# Patient Record
Sex: Female | Born: 1965
Health system: Southern US, Community
[De-identification: ages and names within clinical notes are randomized; demographics above are authoritative.]

## PROBLEM LIST (undated history)

## (undated) DIAGNOSIS — C4492 Squamous cell carcinoma of skin, unspecified: Secondary | ICD-10-CM

## (undated) DIAGNOSIS — E119 Type 2 diabetes mellitus without complications: Secondary | ICD-10-CM

## (undated) DIAGNOSIS — M1990 Unspecified osteoarthritis, unspecified site: Secondary | ICD-10-CM

## (undated) DIAGNOSIS — D649 Anemia, unspecified: Secondary | ICD-10-CM

## (undated) DIAGNOSIS — K5090 Crohn's disease, unspecified, without complications: Secondary | ICD-10-CM

## (undated) DIAGNOSIS — K5190 Ulcerative colitis, unspecified, without complications: Secondary | ICD-10-CM

## (undated) DIAGNOSIS — A64 Unspecified sexually transmitted disease: Secondary | ICD-10-CM

## (undated) HISTORY — DX: Squamous cell carcinoma of skin, unspecified: C44.92

## (undated) HISTORY — DX: Ulcerative colitis, unspecified, without complications: K51.90

## (undated) HISTORY — DX: Type 2 diabetes mellitus without complications: E11.9

## (undated) HISTORY — DX: Anemia, unspecified: D64.9

## (undated) HISTORY — PX: TONSILLECTOMY: SUR1361

## (undated) HISTORY — PX: REDUCTION MAMMAPLASTY: SUR839

## (undated) HISTORY — PX: COLPOSCOPY: SHX161

## (undated) HISTORY — DX: Unspecified sexually transmitted disease: A64

## (undated) HISTORY — PX: OTHER SURGICAL HISTORY: SHX169

## (undated) HISTORY — DX: Unspecified osteoarthritis, unspecified site: M19.90

---

## 1989-08-12 HISTORY — PX: BREAST REDUCTION SURGERY: SHX8

## 1989-11-10 DIAGNOSIS — K5090 Crohn's disease, unspecified, without complications: Secondary | ICD-10-CM | POA: Insufficient documentation

## 1998-05-15 ENCOUNTER — Ambulatory Visit (HOSPITAL_COMMUNITY): Admission: RE | Admit: 1998-05-15 | Discharge: 1998-05-15 | Payer: Self-pay | Admitting: Gastroenterology

## 1998-07-19 ENCOUNTER — Ambulatory Visit (HOSPITAL_COMMUNITY): Admission: RE | Admit: 1998-07-19 | Discharge: 1998-07-19 | Payer: Self-pay | Admitting: Gastroenterology

## 1998-10-11 HISTORY — PX: COLON RESECTION: SHX5231

## 1999-03-12 ENCOUNTER — Other Ambulatory Visit: Admission: RE | Admit: 1999-03-12 | Discharge: 1999-03-12 | Payer: Self-pay | Admitting: Obstetrics and Gynecology

## 2000-03-11 ENCOUNTER — Other Ambulatory Visit: Admission: RE | Admit: 2000-03-11 | Discharge: 2000-03-11 | Payer: Self-pay | Admitting: Obstetrics and Gynecology

## 2001-03-16 ENCOUNTER — Other Ambulatory Visit: Admission: RE | Admit: 2001-03-16 | Discharge: 2001-03-16 | Payer: Self-pay | Admitting: Obstetrics and Gynecology

## 2002-03-23 ENCOUNTER — Other Ambulatory Visit: Admission: RE | Admit: 2002-03-23 | Discharge: 2002-03-23 | Payer: Self-pay | Admitting: Obstetrics and Gynecology

## 2002-11-01 ENCOUNTER — Other Ambulatory Visit: Admission: RE | Admit: 2002-11-01 | Discharge: 2002-11-01 | Payer: Self-pay | Admitting: Obstetrics and Gynecology

## 2002-11-11 HISTORY — PX: APPENDECTOMY: SHX54

## 2002-11-23 ENCOUNTER — Inpatient Hospital Stay (HOSPITAL_COMMUNITY): Admission: EM | Admit: 2002-11-23 | Discharge: 2002-11-24 | Payer: Self-pay | Admitting: Emergency Medicine

## 2002-11-23 ENCOUNTER — Encounter (INDEPENDENT_AMBULATORY_CARE_PROVIDER_SITE_OTHER): Payer: Self-pay | Admitting: Specialist

## 2002-11-23 ENCOUNTER — Encounter: Payer: Self-pay | Admitting: Emergency Medicine

## 2003-03-13 DIAGNOSIS — A64 Unspecified sexually transmitted disease: Secondary | ICD-10-CM

## 2003-03-13 HISTORY — DX: Unspecified sexually transmitted disease: A64

## 2003-04-06 ENCOUNTER — Other Ambulatory Visit: Admission: RE | Admit: 2003-04-06 | Discharge: 2003-04-06 | Payer: Self-pay | Admitting: Obstetrics and Gynecology

## 2004-05-09 ENCOUNTER — Other Ambulatory Visit: Admission: RE | Admit: 2004-05-09 | Discharge: 2004-05-09 | Payer: Self-pay | Admitting: Obstetrics and Gynecology

## 2005-05-20 ENCOUNTER — Other Ambulatory Visit: Admission: RE | Admit: 2005-05-20 | Discharge: 2005-05-20 | Payer: Self-pay | Admitting: Obstetrics and Gynecology

## 2006-05-26 ENCOUNTER — Other Ambulatory Visit: Admission: RE | Admit: 2006-05-26 | Discharge: 2006-05-26 | Payer: Self-pay | Admitting: Obstetrics & Gynecology

## 2007-06-08 ENCOUNTER — Other Ambulatory Visit: Admission: RE | Admit: 2007-06-08 | Discharge: 2007-06-08 | Payer: Self-pay | Admitting: Obstetrics and Gynecology

## 2007-06-13 HISTORY — PX: PARTIAL COLECTOMY: SHX5273

## 2008-06-12 DIAGNOSIS — E119 Type 2 diabetes mellitus without complications: Secondary | ICD-10-CM

## 2008-06-12 HISTORY — DX: Type 2 diabetes mellitus without complications: E11.9

## 2008-06-13 ENCOUNTER — Other Ambulatory Visit: Admission: RE | Admit: 2008-06-13 | Discharge: 2008-06-13 | Payer: Self-pay | Admitting: Obstetrics and Gynecology

## 2008-07-18 ENCOUNTER — Encounter: Admission: RE | Admit: 2008-07-18 | Discharge: 2008-08-16 | Payer: Self-pay | Admitting: Endocrinology

## 2009-06-12 DIAGNOSIS — M1990 Unspecified osteoarthritis, unspecified site: Secondary | ICD-10-CM

## 2009-06-12 HISTORY — DX: Unspecified osteoarthritis, unspecified site: M19.90

## 2009-06-21 ENCOUNTER — Encounter: Admission: RE | Admit: 2009-06-21 | Discharge: 2009-06-21 | Payer: Self-pay | Admitting: Obstetrics and Gynecology

## 2009-10-10 HISTORY — PX: ESSURE TUBAL LIGATION: SUR464

## 2009-12-29 ENCOUNTER — Ambulatory Visit (HOSPITAL_COMMUNITY): Admission: RE | Admit: 2009-12-29 | Discharge: 2009-12-29 | Payer: Self-pay | Admitting: Obstetrics and Gynecology

## 2010-09-01 ENCOUNTER — Encounter: Payer: Self-pay | Admitting: Obstetrics and Gynecology

## 2010-09-02 ENCOUNTER — Encounter: Payer: Self-pay | Admitting: Obstetrics and Gynecology

## 2010-12-28 NOTE — Op Note (Signed)
NAMEARLINA, Chelsey James NO.:  [id number]   MEDICAL RECORD NO.:  [id number]                   PATIENT TYPE:  INP   LOCATION:  1823                                 FACILITY:  [facility]   PHYSICIAN:  Abigail Miyamoto, M.D.              DATE OF BIRTH:  1966-03-27   DATE OF PROCEDURE:  11/23/2002  DATE OF DISCHARGE:                                 OPERATIVE REPORT   PREOPERATIVE DIAGNOSIS:  Acute appendicitis.   POSTOPERATIVE DIAGNOSIS:  Acute appendicitis.   OPERATION:  Laparoscopic appendectomy.   SURGEON:  Abigail Miyamoto, M.D.   ANESTHESIA:  General endotracheal anesthesia, 0.25% Marcaine.   ESTIMATED BLOOD LOSS:  Minimal   INDICATIONS FOR PROCEDURE:  The patient is a 45 year old female who has had  Crohn's disease in the past, who presented with right lower quadrant  abdominal pain.  She was also found to have a fever and an elevated white  blood count.  CAT scan of the abdomen was performed which showed findings  consistent with acute appendicitis.   FINDINGS:  The patient was indeed found to have acute appendicitis without  evidence of rupture.  No evidence of Crohn's disease was identified.   DESCRIPTION OF PROCEDURE:  The patient was brought to the operating room and  identified.  She was placed supine on the operating room table and general  anesthesia was induced.  Her abdomen was then prepped and draped in the  usual sterile fashion.  Using a #16 blade a small transverse incision was  made below the umbilicus.  Incision was carried down to the fascia which was  made with a scalpel.  Hemostat was used to incise into the peritoneal  cavity.  Next a 0 Vicryl pursestring suture was placed around the fascial  opening.  The Hasson port was placed through the opening and insufflation of  the abdomen was begun.  Visualization of the cecum revealed some turbid  fluid around the cecum.  Next a 5 mm port was placed in the patient's right  upper  quadrant and a 10 mm port was placed in the patient's lower midline  under direct vision.  Dissection was then carried out around the appendix.  The appendix was identified and found to be acutely inflamed and enlarged.  The rest of the cecum and terminal ileum appeared normal.  Several adhesions  were then taken down with the harmonic scalpel.  The mesoappendix was then  identified and controlled blood clots with the harmonic scalpel.  Further  dissection revealed the base of the appendix.  The base of the appendix was  then transected with two separate firings of the EndoGIA stapler.  Once the  appendix was removed, the sacral arm was examined and found to be intact.  Hemostasis likewise appeared to be achieved.  At this point, the appendix  was placed in an endosac.  The abdominal was then copiously irrigated with  normal saline.  Again hemostasis appeared to be achieved.  Further  visualization of the abdomen failed to reveal any active Crohn's disease.  The patient did have some adhesions up to the midline that were left in  place.  They were just below the incision of the umbilicus.  The appendix  was then removed with the bag through the midline through the umbilical port  and a 0 Vicryl was tied in place closing the fascial defect.  Both the other  ports were likewise removed.  A Veress needle was placed through the right  upper quadrant incision in order to help remove the rest of the air out of  the abdominal cavity.  All port sites were then infiltrated with 0.25%  Marcaine and then closed with 4-  0 Monocryl subcuticular sutures.  Steri-Strips, gauze and tape were then  applied.  The patient tolerated the procedure well.  All sponge, needle and  instrument counts were correct at the end of the procedure.  The patient was  then extubated in the operating room and taken in stable condition to the  recovery room.                                               Abigail Miyamoto,  M.D.    DB/MEDQ  D:  11/23/2002  T:  11/23/2002  Job:  [id number]   cc:   Bernette Redbird, M.D.  [address]  [place], [place] [address]  Fax: [phone]

## 2010-12-28 NOTE — Consult Note (Signed)
Chelsey James, ABEE NO.:  [id number]   MEDICAL RECORD NO.:  [id number]                   PATIENT TYPE:  INP   LOCATION:  1823                                 FACILITY:  [facility]   PHYSICIAN:  Abigail Miyamoto, M.D.              DATE OF BIRTH:  [birth year]   DATE OF CONSULTATION:  11/23/2002  DATE OF DISCHARGE:                                   CONSULTATION   CHIEF COMPLAINT:  Right lower quadrant abdominal pain.   HISTORY OF PRESENT ILLNESS:  This is a 45 year old female with a history of  Crohn's disease in the past who developed sudden right lower quadrant  abdominal pain last evening.  It has progressed and become continually  worse.  She has had no nausea with this and has had some diarrhea stools but  no blood.  She says the pain is not similar to any kind she has had in the  past.  She has not had a flare up of Crohn's for over four years.  She has  had no chest pain or shortness of breath.  She has had fever and chills.  She has had no dysuria.   PAST MEDICAL HISTORY:  Crohn's disease for which she has had a left partial  colectomy and closure of a colovesical fistula.  This was done in [place], [place], approximately four years ago and again she has had no  problems since then.  The rest of her medical history is otherwise  unremarkable.   MEDICATIONS:  _______, Clarinex, Nasonex, Maxair.   ALLERGIES:  CIPRO, KEFLEX, and VALIUM.   HABITS:  She does smoke and drink alcohol occasionally.   REVIEW OF SYSTEMS:  This is otherwise unremarkable.   PHYSICAL EXAMINATION:  GENERAL APPEARANCE:  A well-developed, well-nourished  female in no acute distress.  VITAL SIGNS:  Temperature 101.4, heart rate 85, blood pressure 109/66,  respiratory rate 16.  HEENT:  Eyes:  She is anicteric.  Ears, nose mouth and throat:  Oropharynx  is clear.  NECK:  Supple.  LUNGS:  Clear to auscultation bilaterally.  CARDIOVASCULAR:  Regular rate and  rhythm.  ABDOMEN:  Soft.  There is a well-healed transverse incision.  There is  tenderness with guarding in the right lower quadrant.  There are no masses.  There is no organomegaly.  EXTREMITIES:  Warm and well perfused.  Gait is normal.   LABORATORY DATA:  The patient had an elevated white blood cell count of 14.8  with hemoglobin 12.2; neutrophils 80%.  Electrolytes normal.  Liver function  tests were normal.  Urinalysis was negative.   The patient had a CAT scan of the abdomen and pelvis which shows  inflammation around the cecum and a possibly enlarged appendix with enlarged  periappendiceal lymph nodes.  The rest of the abdomen is unremarkable.   IMPRESSION:  A patient with acute appendicitis versus a flare-up of Crohn's  disease.  Given the CAT scan findings and her history, this sounds more like  appendicitis than a flare-up of Crohn's as she has not had a problem with  her Crohn's for four years.  At this point, we will proceed with a  diagnostic laparoscopy and possible  laparoscopic appendectomy.  Risks of the surgery were discussed with the  patient including risks of bleeding, infection, need for open surgery, etc.,  and at this point she wishes to proceed.  Surgery was to be scheduled for as  soon as possible.                                               Abigail Miyamoto, M.D.    DB/MEDQ  D:  11/23/2002  T:  11/23/2002  Job:  [id number]

## 2010-12-28 NOTE — H&P (Signed)
NAME:  Chelsey James, Chelsey James                        ACCOUNT NO.:  [id number]   MEDICAL RECORD NO.:  [id number]                   PATIENT TYPE:  EMS   LOCATION:  MAJO                                 FACILITY:  [facility]   PHYSICIAN:  John C. Madilyn Fireman, M.D.                 DATE OF BIRTH:  Apr 27, 1966   DATE OF ADMISSION:  11/22/2002  DATE OF DISCHARGE:                                HISTORY & PHYSICAL   CHIEF COMPLAINT:  Right lower quadrant abdominal pain.   HISTORY OF PRESENT ILLNESS:  The patient is a 45 year old white female with  history of Crohn's colitis, status post apparent left colon resection due to  a colovesicals fistula four years ago at [facility].  She has been in  fairly good state of health since with no major Crohn's flares and takes  only for her Crohn's disease and has not recently required steroids.  She began having fairly sudden onset of sharp right lower quadrant abdominal  pain and tenderness beginning about 4 p.m. yesterday.  This became  increasingly severe.  She was taken to the emergency room where she was  found to have marked tenderness, temperature 101.8, white blood cell count  14,800 and a CT scan showed inflammation and mesenteric adenitis in the  proximal right colon with questionable visualization of the appendix.  Differential diagnosis thought to be appendicitis versus Crohn's disease.  Patient is comfortable as long as she lies flat though if she moves at all,  she is experiencing significant pain.   PAST MEDICAL HISTORY:  1. Crohn's disease as outlined above.  2. Asthma.   PAST SURGICAL HISTORY:  1. Partial colectomy and repair of fistula from colon to bladder four years     ago by Dr. Epifania Gore.  2. Breast reduction.  3. Tonsillectomy.   FAMILY HISTORY:  The patient is adopted but thinks diabetes runs in her  family.   MEDICATIONS:  1. Maxair.  2. .  3. Clarinex.  4. Advair.   SOCIAL HISTORY:  The patient is single.  She works for [employer].  She  denies cigarette smoking, drinks alcohol occasionally.   PHYSICAL EXAMINATION:  GENERAL:  Well-developed, well-nourished white female  in no acute distress.  Comfortable as long as she is lying still.  VITAL SIGNS:  Temperature 101.4.  HEENT:  Unremarkable.  HEART:  Regular rate and rhythm without murmur.  LUNGS:  Clear.  ABDOMEN:  Soft, nondistended, hypoactive bowel sounds, no hepatosplenomegaly  or masses.  The majority of her abdomen is nontender but she is exquisitely  tender in her right lower quadrant and guards to fairly light palpation in a  large area of the right lower quadrant.  I can not clearly detect rebound  tenderness due to the amount of guarding and pain experienced on palpation  itself.  RECTAL:  Exam is deferred.  PELVIC:  Exam was apparently done by  emergency room physician.  EXTREMITIES:  Without cyanosis, clubbing or edema.   LABORATORY DATA:  White blood cell count 14,800, hemoglobin 12.2, platelet  count 414,000.   IMPRESSION:  Acute inflammatory process in right lower quadrant.  Differential diagnosis of appendicitis versus Crohn's disease.   PLAN:  Will need surgical consultation and at minimum will start her on  Cipro and Flagyl.  If surgery does not feel that she requires urgent  appendectomy and thinks this is more likely Crohn's disease will probably  start corticosteroids and observe her closely.                                                John C. Madilyn Fireman, M.D.    JCH/MEDQ  D:  11/23/2002  T:  11/23/2002  Job:  [id number]   cc:   Bernette Redbird, M.D.  [address] [place]  [place], [place] [address]  Fax: [phone]   [facility]

## 2011-05-27 ENCOUNTER — Emergency Department (INDEPENDENT_AMBULATORY_CARE_PROVIDER_SITE_OTHER): Payer: 59

## 2011-05-27 ENCOUNTER — Encounter: Payer: Self-pay | Admitting: *Deleted

## 2011-05-27 ENCOUNTER — Emergency Department (HOSPITAL_BASED_OUTPATIENT_CLINIC_OR_DEPARTMENT_OTHER)
Admission: EM | Admit: 2011-05-27 | Discharge: 2011-05-28 | Disposition: A | Discharge status: Home / Self Care | Payer: 59 | Attending: Emergency Medicine | Admitting: Emergency Medicine

## 2011-05-27 DIAGNOSIS — Z79899 Other long term (current) drug therapy: Secondary | ICD-10-CM | POA: Insufficient documentation

## 2011-05-27 DIAGNOSIS — J984 Other disorders of lung: Secondary | ICD-10-CM | POA: Insufficient documentation

## 2011-05-27 DIAGNOSIS — R109 Unspecified abdominal pain: Secondary | ICD-10-CM | POA: Insufficient documentation

## 2011-05-27 DIAGNOSIS — R110 Nausea: Secondary | ICD-10-CM

## 2011-05-27 DIAGNOSIS — K7689 Other specified diseases of liver: Secondary | ICD-10-CM

## 2011-05-27 DIAGNOSIS — K5090 Crohn's disease, unspecified, without complications: Secondary | ICD-10-CM | POA: Insufficient documentation

## 2011-05-27 DIAGNOSIS — R112 Nausea with vomiting, unspecified: Secondary | ICD-10-CM

## 2011-05-27 HISTORY — DX: Crohn's disease, unspecified, without complications: K50.90

## 2011-05-27 LAB — LIPASE, BLOOD: Lipase: 29 U/L (ref 11–59)

## 2011-05-27 LAB — COMPREHENSIVE METABOLIC PANEL
ALT: 33 U/L (ref 0–35)
AST: 24 U/L (ref 0–37)
Albumin: 3.8 g/dL (ref 3.5–5.2)
Alkaline Phosphatase: 105 U/L (ref 39–117)
BUN: 11 mg/dL (ref 6–23)
CO2: 28 mEq/L (ref 19–32)
Calcium: 9.7 mg/dL (ref 8.4–10.5)
Chloride: 100 mEq/L (ref 96–112)
Creatinine, Ser: 0.5 mg/dL (ref 0.50–1.10)
GFR calc Af Amer: >90 mL/min (ref >90)
GFR calc non Af Amer: >90 mL/min (ref >90)
Glucose, Bld: 130 mg/dL — ABNORMAL HIGH (ref 70–99)
Potassium: 4.2 mEq/L (ref 3.5–5.1)
Sodium: 138 mEq/L (ref 135–145)
Total Bilirubin: 0.7 mg/dL (ref 0.3–1.2)
Total Protein: 7.4 g/dL (ref 6.0–8.3)

## 2011-05-27 LAB — URINALYSIS, ROUTINE W REFLEX MICROSCOPIC
Nitrite: NEGATIVE
Protein, ur: NEGATIVE mg/dL
Specific Gravity, Urine: 1.006 (ref 1.005–1.030)
Urobilinogen, UA: 0.2 mg/dL (ref 0.0–1.0)

## 2011-05-27 LAB — PREGNANCY, URINE: Preg Test, Ur: NEGATIVE

## 2011-05-27 LAB — DIFFERENTIAL
Basophils Absolute: 0 10*3/uL (ref 0.0–0.1)
Basophils Relative: 0 % (ref 0–1)
Eosinophils Absolute: 0.2 10*3/uL (ref 0.0–0.7)
Eosinophils Relative: 2 % (ref 0–5)
Lymphocytes Relative: 18 % (ref 12–46)
Lymphs Abs: 2.6 10*3/uL (ref 0.7–4.0)
Monocytes Absolute: 0.9 10*3/uL (ref 0.1–1.0)
Monocytes Relative: 6 % (ref 3–12)
Neutro Abs: 10.9 10*3/uL — ABNORMAL HIGH (ref 1.7–7.7)
Neutrophils Relative %: 74 % (ref 43–77)

## 2011-05-27 LAB — CBC
HCT: 39.2 % (ref 36.0–46.0)
Hemoglobin: 12.7 g/dL (ref 12.0–15.0)
MCH: 31.1 pg (ref 26.0–34.0)
MCHC: 32.4 g/dL (ref 30.0–36.0)
MCV: 95.8 fL (ref 78.0–100.0)
Platelets: 343 10*3/uL (ref 150–400)
RBC: 4.09 MIL/uL (ref 3.87–5.11)
RDW: 15.1 % (ref 11.5–15.5)
WBC: 14.7 10*3/uL — ABNORMAL HIGH (ref 4.0–10.5)

## 2011-05-27 MED ORDER — IOHEXOL 300 MG/ML  SOLN
100.0000 mL | Freq: Once | INTRAMUSCULAR | Status: AC | PRN
  Administered 2011-05-27: 100 mL via INTRAVENOUS

## 2011-05-27 NOTE — ED Provider Notes (Signed)
History     CSN: [id number] Arrival date & time: 05/27/2011  6:44 PM  Chief Complaint  Patient presents with  . Abdominal Pain    (Consider location/radiation/quality/duration/timing/severity/associated sxs/prior treatment) Patient is a 45 y.o. female presenting with abdominal pain. The history is provided by the patient.  Abdominal Pain The primary symptoms of the illness include abdominal pain. The primary symptoms of the illness do not include fever, shortness of breath, nausea, vomiting, diarrhea, hematochezia, dysuria, vaginal discharge or vaginal bleeding. The onset of the illness was gradual. The problem has not changed since onset. The patient states that she believes she is currently not pregnant. The patient has not had a change in bowel habit. Risk factors for an acute abdominal problem include a history of abdominal surgery. Symptoms associated with the illness do not include chills, diaphoresis, constipation, hematuria, frequency or back pain.   Patient has Crohns with resection of her large bowel. She has had a J pouch procedure done in the past.  Past Medical History  Diagnosis Date  . Crohn's     Past Surgical History  Procedure Date  . Breast surgery   . Tonsillectomy   . Appendectomy   . Tubal ligation     History reviewed. No pertinent family history.  History  Substance Use Topics  . Smoking status: Never Smoker   . Smokeless tobacco: Not on file  . Alcohol Use: No    OB History    Grav Para Term Preterm Abortions TAB SAB Ect Mult Living                  Review of Systems  Constitutional: Negative for fever, chills and diaphoresis.  Respiratory: Negative for shortness of breath.   Cardiovascular: Negative for chest pain.  Gastrointestinal: Positive for abdominal pain. Negative for nausea, vomiting, diarrhea, constipation and hematochezia.  Genitourinary: Negative for dysuria, frequency, hematuria, vaginal bleeding and vaginal discharge.    Musculoskeletal: Negative for back pain.  All other systems reviewed and are negative.    Allergies  Ciprofloxacin and Valium  Home Medications   Current Outpatient Rx  Name Route Sig Dispense Refill  . B COMPLEX PO TABS Oral Take 1 tablet by mouth daily.      Marland Kitchen VITAMIN D 2000 UNITS PO TABS Oral Take 2,000 Units by mouth daily. Take every day except on Monday     . LOPERAMIDE HCL 2 MG PO CAPS Oral Take 2 mg by mouth 4 (four) times daily as needed. For constipation     . METHOTREXATE (ANTI-RHEUMATIC) 2.5 MG PO TABS Oral Take 22 mg by mouth every 7 (seven) days. Take on thursday     . ONE-DAILY MULTI VITAMINS PO TABS Oral Take 1 tablet by mouth daily.      Marland Kitchen SIMETHICONE 125 MG PO CHEW Oral Chew 125 mg by mouth every 6 (six) hours as needed. For consitpation     . VITAMIN D (ERGOCALCIFEROL) 50000 UNITS PO CAPS Oral Take 50,000 Units by mouth every 7 (seven) days. Take on Monday       BP 132/93  Pulse 117  Temp(Src) 98.8 F (37.1 C) (Oral)  Resp 16  Ht 5\' 1"  (1.549 m)  Wt 159 lb (72.122 kg)  BMI 30.04 kg/m2  SpO2 100%  LMP 05/21/2011  Physical Exam  Nursing note and vitals reviewed. Constitutional: She is oriented to person, place, and time. She appears well-developed and well-nourished.  HENT:  Head: Normocephalic and atraumatic.  Eyes: Pupils are equal, round,  and reactive to light.  Neck: Normal range of motion.  Cardiovascular: Normal rate, regular rhythm and normal heart sounds.   Pulmonary/Chest: Effort normal and breath sounds normal.  Abdominal: Soft. Normal appearance. She exhibits no distension and no mass. Bowel sounds are increased. There is no splenomegaly or hepatomegaly. There is tenderness in the right lower quadrant and left lower quadrant. There is no rigidity, no rebound and no guarding.    Musculoskeletal: Normal range of motion.  Neurological: She is alert and oriented to person, place, and time.    ED Course  Procedures (including critical care  time)  Labs Reviewed  CBC - Abnormal; Notable for the following:    WBC 14.7 (*)    All other components within normal limits  DIFFERENTIAL - Abnormal; Notable for the following:    Neutro Abs 10.9 (*)    All other components within normal limits  COMPREHENSIVE METABOLIC PANEL - Abnormal; Notable for the following:    Glucose, Bld 130 (*)    All other components within normal limits  URINALYSIS, ROUTINE W REFLEX MICROSCOPIC  PREGNANCY, URINE  LIPASE, BLOOD   Dg Abd Acute W/chest  05/27/2011  *RADIOLOGY REPORT*  Clinical Data: Abdominal pain, history of Crohn's disease  ACUTE ABDOMEN SERIES (ABDOMEN 2 VIEW & CHEST 1 VIEW)  Comparison: None.  Findings: Cardiomediastinal silhouette is within normal limits. The lungs are clear. No pleural effusion.  No pneumothorax.  No acute osseous abnormality.  No free air beneath the diaphragm.  Left mid abdominal surgical clips noted.  Essure implants noted projecting over the pelvis.  Chain sutures noted over the low pelvis.  No air fluid level or dilated loop of bowel.  No abnormal calcific opacity.  No acute osseous finding.  IMPRESSION: No acute cardiopulmonary process, no evidence for bowel obstruction.  Original Report Authenticated By: Harrel Lemon, M.D.     No diagnosis found.    MDM  Patient has abdominal pain that is diffuse in nature. The patient will have CT scan done to look for any obstructive process.        Carlyle Dolly, PA 06/06/11 1527  Carlyle Dolly, PA 06/06/11 [phone]

## 2011-05-27 NOTE — ED Notes (Signed)
Pt c/o generalized abd pain x 2 days .

## 2011-05-27 NOTE — ED Notes (Signed)
Returned from xray

## 2011-05-28 MED ORDER — ONDANSETRON HCL 4 MG PO TABS: 8.0000 mg | ORAL_TABLET | Freq: Four times a day (QID) | ORAL | Status: AC | PRN

## 2011-05-28 NOTE — ED Notes (Signed)
Patient given crackers and water with permission from EDP

## 2011-05-28 NOTE — ED Provider Notes (Signed)
History     CSN: [id number] Arrival date & time: 05/27/2011  6:44 PM  Chief Complaint  Patient presents with  . Abdominal Pain    (Consider location/radiation/quality/duration/timing/severity/associated sxs/prior treatment) HPI  Past Medical History  Diagnosis Date  . Crohn's     Past Surgical History  Procedure Date  . Breast surgery   . Tonsillectomy   . Appendectomy   . Tubal ligation     History reviewed. No pertinent family history.  History  Substance Use Topics  . Smoking status: Never Smoker   . Smokeless tobacco: Not on file  . Alcohol Use: No    OB History    Grav Para Term Preterm Abortions TAB SAB Ect Mult Living                  Review of Systems  Allergies  Ciprofloxacin and Valium  Home Medications   Current Outpatient Rx  Name Route Sig Dispense Refill  . B COMPLEX PO TABS Oral Take 1 tablet by mouth daily.      Marland Kitchen VITAMIN D 2000 UNITS PO TABS Oral Take 2,000 Units by mouth daily. Take every day except on Monday     . LOPERAMIDE HCL 2 MG PO CAPS Oral Take 2 mg by mouth 4 (four) times daily as needed. For constipation     . METHOTREXATE (ANTI-RHEUMATIC) 2.5 MG PO TABS Oral Take 22 mg by mouth every 7 (seven) days. Take on thursday     . ONE-DAILY MULTI VITAMINS PO TABS Oral Take 1 tablet by mouth daily.      Marland Kitchen SIMETHICONE 125 MG PO CHEW Oral Chew 125 mg by mouth every 6 (six) hours as needed. For consitpation     . VITAMIN D (ERGOCALCIFEROL) 50000 UNITS PO CAPS Oral Take 50,000 Units by mouth every 7 (seven) days. Take on Monday       BP 112/71  Pulse 79  Temp(Src) 98.8 F (37.1 C) (Oral)  Resp 16  Ht 5\' 1"  (1.549 m)  Wt 159 lb (72.122 kg)  BMI 30.04 kg/m2  SpO2 100%  LMP 05/21/2011  Physical Exam  ED Course  Procedures (including critical care time)  Labs Reviewed  CBC - Abnormal; Notable for the following:    WBC 14.7 (*)    All other components within normal limits  DIFFERENTIAL - Abnormal; Notable for the following:    Neutro Abs 10.9 (*)    All other components within normal limits  COMPREHENSIVE METABOLIC PANEL - Abnormal; Notable for the following:    Glucose, Bld 130 (*)    All other components within normal limits  URINALYSIS, ROUTINE W REFLEX MICROSCOPIC  PREGNANCY, URINE  LIPASE, BLOOD   Ct Abdomen Pelvis W Contrast  05/27/2011  *RADIOLOGY REPORT*  Clinical Data: Abdominal pain, nausea, vomiting.  History of Crohn's disease.  CT ABDOMEN AND PELVIS WITH CONTRAST  Technique:  Multidetector CT imaging of the abdomen and pelvis was performed following the standard protocol during bolus administration of intravenous contrast.  Contrast: OMNIPAQUE IOHEXOL 300 MG/ML IV SOLN  Comparison: Plain films 05/27/2011.  Findings: Tiny nodule in the right lower lobe, measuring 3 mm.  The lung bases otherwise clear.  No effusions.  Mild fatty infiltration of the liver.  No focal abnormality. Gallbladder, spleen, pancreas, adrenals, left kidney unremarkable. Small cyst in the mid pole of the right kidney.  No hydronephrosis.  Postoperative changes are noted in the pelvis and left abdomen. Changes of prior sub total colectomy.  There  is wall thickening within the distal small bowel just proximal to the colonic anastomosis concerning for recurrent inflammatory bowel disease. Proximal to this, several distal small bowel loops are mildly prominent and fluid-filled.  Mild inflammation surrounds the thickened small bowel loop.  There is a small amount of free fluid in the pelvis.  Uterus and adnexa unremarkable.  Bilateral fallopian tube coils visualized. No free air.  No adenopathy.  Aorta is normal caliber.  No acute bony abnormality.  IMPRESSION: Mild wall thickening and surrounding inflammation within the distal small bowel loop just proximal to the colonic anastomosis.  Several pelvic small bowel loops are slightly prominent fluid-filled. Favor mild ileus.  Small amount of free fluid in the pelvis.  Fatty liver.  3 mm right  lower lobe pulmonary nodule. If the patient is at high risk for bronchogenic carcinoma, follow-up chest CT at 1 year is recommended.  If the patient is at low risk, no follow-up is needed.  This recommendation follows the consensus statement: Guidelines for Management of Small Pulmonary Nodules Detected on CT Scans:  A Statement from the Fleischner Society as published in Radiology 2005; 237:395-400.  Available online at: DietDisorder.cz.  Original Report Authenticated By: Cyndie Chime, M.D.   Dg Abd Acute W/chest  05/27/2011  *RADIOLOGY REPORT*  Clinical Data: Abdominal pain, history of Crohn's disease  ACUTE ABDOMEN SERIES (ABDOMEN 2 VIEW & CHEST 1 VIEW)  Comparison: None.  Findings: Cardiomediastinal silhouette is within normal limits. The lungs are clear. No pleural effusion.  No pneumothorax.  No acute osseous abnormality.  No free air beneath the diaphragm.  Left mid abdominal surgical clips noted.  Essure implants noted projecting over the pelvis.  Chain sutures noted over the low pelvis.  No air fluid level or dilated loop of bowel.  No abnormal calcific opacity.  No acute osseous finding.  IMPRESSION: No acute cardiopulmonary process, no evidence for bowel obstruction.  Original Report Authenticated By: Harrel Lemon, M.D.     No diagnosis found.    MDM  Pt with possible mild ileus and inflammation on CT.  Pt with mild tenderness on exam but no nausea now, tol by mouth fluids without difficulty.  Patient also notes that she's had multiple bowel movements since 4:30 this afternoon now.  She states her bowel movements are essentially back to normal.  If possible patient preferred not to be admitted at this time.  I did have an extensive discussion with Dr. Andi Devon at [facility] where the patient has seen the GI service and the surgical service in the past.  At this point in time he recommends the patient does have very close followup for endoscopy  and colonoscopy to evaluate her Crohn's disease.  He did not recommend further antibiotics or steroids at this point in time.  Given patient's lack of fever and relatively well appearance I feel she can be discharged home at this point in time.  Patient's potential ileus appears to be improving clinically already.  Patient also understands her disease process well and understands that she should start with fluids and soft foods before moving on to more solid foods.  She also understands the need to return for any worsening pain, fevers, vomiting.        Nat Christen, MD 05/28/11 [phone]

## 2011-06-06 NOTE — ED Provider Notes (Signed)
Medical screening examination/treatment/procedure(s) were performed by non-physician practitioner and as supervising physician I was immediately available for consultation/collaboration.  Omolola Mittman K Linker, MD 06/06/11 1715 

## 2011-09-13 HISTORY — PX: SHOULDER ARTHROSCOPY W/ ACROMIAL REPAIR: SUR94

## 2012-07-27 LAB — HM PAP SMEAR: HM Pap smear: NEGATIVE

## 2012-11-19 ENCOUNTER — Telehealth: Payer: Self-pay | Admitting: Nurse Practitioner

## 2012-11-19 NOTE — Telephone Encounter (Signed)
Pt still has not started period after 2 weeks on the prevera. Please call to advise.

## 2012-11-20 NOTE — Telephone Encounter (Signed)
PATIENT CALLED OF NO CYCLE AFTER 2 WEEKS OF FINISHING 10 DAY TIME FRAME OF PROVERA. PATIENT STATES HER HOT FLASHES HAVE IMPROVED AND SHE IS SLEEPING BETTER. PLEASE ADVISE OF NEXT PLAN FOR PATIENT. SUE. PATIENT UNDERSTOOD PATTY GRUBB WAS OUT OF OFFICE AND WE WILL CALL HER BACK ON Monday WITH RESPONSE.   SUE  CHART IN YOUR OFFICE.

## 2012-11-20 NOTE — Telephone Encounter (Signed)
LEFT MESSAGE ON CB# TO RETURN CALL TO NURSE. SUE

## 2012-11-23 NOTE — Telephone Encounter (Signed)
[facility] NOTIFIED OF NEED TO COME FOR [facility] LAB TEST ON Monday 12/03/2012 @ 8:45am

## 2012-11-23 NOTE — Telephone Encounter (Signed)
Lets have her come in for [facility] lab, that will decide where she is in menopause.

## 2012-12-02 ENCOUNTER — Telehealth: Payer: Self-pay | Admitting: Nurse Practitioner

## 2012-12-02 NOTE — Telephone Encounter (Signed)
Patient coming tomorrow for [facility] testing only in lab. Would like to have her Vitamin D level checked also since she has decreased her dose of medication. Please advise. sue

## 2012-12-02 NOTE — Telephone Encounter (Signed)
Pt would like to have vit d check during lab appt tomorrow.

## 2012-12-03 ENCOUNTER — Other Ambulatory Visit (INDEPENDENT_AMBULATORY_CARE_PROVIDER_SITE_OTHER): Payer: 59

## 2012-12-03 ENCOUNTER — Other Ambulatory Visit: Payer: Self-pay | Admitting: Nurse Practitioner

## 2012-12-03 DIAGNOSIS — E559 Vitamin D deficiency, unspecified: Secondary | ICD-10-CM

## 2012-12-03 DIAGNOSIS — N912 Amenorrhea, unspecified: Secondary | ICD-10-CM

## 2012-12-03 NOTE — Telephone Encounter (Signed)
Orders have been added this am and Morrie Sheldon has the chart to draw labs.

## 2012-12-07 ENCOUNTER — Telehealth: Payer: Self-pay | Admitting: *Deleted

## 2012-12-07 NOTE — Telephone Encounter (Signed)
Message copied by Osie Bond on Mon Dec 07, 2012  2:41 PM ------      Message from: Ria Comment R      Created: Mon Dec 07, 2012  9:22 AM       Smt Lokey let patient know that her [facility] is menopausal, but numbers are low enough that she still may have a menses on her own.  If she does and it is long and haevy to CB.  Otherwise call again if no menses in 3 months. Vit D is good, follow protocol. ------

## 2012-12-07 NOTE — Telephone Encounter (Signed)
Pt is aware of all lab results and will call if she has heavy bleeding with menses/or no menses x3 months. Pt will take Vitamin D 600 IU qd (otc).

## 2013-04-21 ENCOUNTER — Other Ambulatory Visit: Payer: Self-pay

## 2013-04-21 DIAGNOSIS — Z1231 Encounter for screening mammogram for malignant neoplasm of breast: Secondary | ICD-10-CM

## 2013-04-29 ENCOUNTER — Ambulatory Visit (INDEPENDENT_AMBULATORY_CARE_PROVIDER_SITE_OTHER): Payer: Self-pay | Admitting: Licensed Clinical Social Worker

## 2013-04-29 DIAGNOSIS — F4325 Adjustment disorder with mixed disturbance of emotions and conduct: Secondary | ICD-10-CM

## 2013-04-29 NOTE — Progress Notes (Unsigned)
Presenting Problem Chief Complaint: ***  What are the main stressors in your life right now, how long? Racing Thoughts   2 and Irritability   1   Previous mental health services Have you ever been treated for a mental health problem, when, where, by whom? No     Are you currently seeing a therapist or counselor, counselor's name? No   Have you ever had a mental health hospitalization, how many times, length of stay? No   Have you ever been treated with medication, name, reason, response? No   Have you ever had suicidal thoughts or attempted suicide, when, how? No   Risk factors for Suicide Demographic factors:  Divorced or widowed and Caucasian Current mental status: {CHL AMB BH Suicide Current Mental Status:21022748:a} Loss factors: {CHL AMB BH Suicide Loss Factors:21022749:a} Historical factors: {CHL AMB BH Suicide Historical Factors:21022750:a} Risk Reduction factors: Employed, Living with another person, especially a relative, Positive social support and Positive coping skills or problem solving skills Clinical factors:  {Clinical Factors:22706} Cognitive features that contribute to risk: {Cognitive Features:22703}    SUICIDE RISK:  Minimal: No identifiable suicidal ideation.  Patients presenting with no risk factors but with morbid ruminations; may be classified as minimal risk based on the severity of the depressive symptoms  Medical history Medical treatment and/or problems, explain: Yes   Crohn's disease, Diabetes 2 Do you have any issues with chronic pain?  No  Name of primary care physician/last physical exam: none  Allergies: Yes Medication, reactions? Cipro and Valium  -  Can't walk, get Medications:  See medication section Prescribed by:   Is there any history of mental health problems or substance abuse in your family, whom? No  Has anyone in your family been hospitalized, who, where, length of stay? No   Social/family history Have you been married, how many  times?  once  Do you have children?  none  How many pregnancies have you had?  none  Who lives in your current household? Patient and BF Stacey and 2 dogs  Military history: No   Religious/spiritual involvement:  What religion/faith base are you? Baptist  Not active  Family of origin (childhood history)  Where were you born? [place] Where did you grow up[place] How many different homes have you lived? 4 Describe the atmosphere of the household where you grew up: Fine  Wanted and loved Do you have siblings, step/half siblings, list names, relation, sex, age? Yes  Brother  Darren age 68  Are your parents separated/divorced, when and why? No   Are your parents alive? Yes   Social supports (personal and professional): Misty Stanley, family and friends  Education How many grades have you completed? technical college Did you have any problems in school, what type? No  Medications prescribed for these problems? No   Employment (financial issues)   Legal history   Trauma/Abuse history: Have you ever been exposed to any form of abuse, what type? No   Have you ever been exposed to something traumatic, describe? Yes chocked  Substance use Do you use Caffeine? Yes Type, frequency? Coffee occasionally  Do you use Nicotine? Yes Type, frequency, ppd? Pack every three days   Do you use Alcohol? Yes Type, frequency? Ocassional  How old were you went you first tasted alcohol? 15 Was this accepted by your family? No  When was your last drink, type, how much? August   Have you ever used illicit drugs or taken more than prescribed, type, frequency,  date of last usage? No   Mental Status: General Appearance /Behavior:  Casual Eye Contact:  Good Motor Behavior:  Normal Speech:  Normal Level of Consciousness:  Alert Mood:  Euthymic Affect:  Appropriate Anxiety Level:  Minimal Thought Process:  Coherent and Relevant Thought Content:  WNL Perception:  Normal Judgment:   Good Insight:  Absent Cognition:  Orientation time, place and person  Diagnosis AXIS I Adjustment Disorder with Mixed Emotional Features  AXIS II Deferred  AXIS III Past Medical History  Diagnosis Date  . Crohn's     AXIS IV problems with primary support group  AXIS V 61-70 mild symptoms   Plan: Develop rapport and make treatment plan  _________________________________________    Merlene Morse, LCSW / Date  04/29/2013

## 2013-05-13 ENCOUNTER — Ambulatory Visit (INDEPENDENT_AMBULATORY_CARE_PROVIDER_SITE_OTHER): Payer: 59 | Admitting: Licensed Clinical Social Worker

## 2013-05-17 ENCOUNTER — Ambulatory Visit
Admission: RE | Admit: 2013-05-17 | Discharge: 2013-05-17 | Disposition: A | Discharge status: Home / Self Care | Payer: 59 | Source: Ambulatory Visit

## 2013-05-17 DIAGNOSIS — Z1231 Encounter for screening mammogram for malignant neoplasm of breast: Secondary | ICD-10-CM

## 2013-06-17 ENCOUNTER — Other Ambulatory Visit: Payer: Self-pay

## 2013-07-27 ENCOUNTER — Encounter: Payer: Self-pay | Admitting: Nurse Practitioner

## 2013-07-29 ENCOUNTER — Ambulatory Visit (INDEPENDENT_AMBULATORY_CARE_PROVIDER_SITE_OTHER): Payer: 59 | Admitting: Nurse Practitioner

## 2013-07-29 ENCOUNTER — Encounter: Payer: Self-pay | Admitting: Nurse Practitioner

## 2013-07-29 VITALS — BP 130/92 | HR 72 | Resp 16 | Ht 61.75 in | Wt 166.0 lb

## 2013-07-29 DIAGNOSIS — Z0000 Encounter for general adult medical examination without abnormal findings: Secondary | ICD-10-CM

## 2013-07-29 DIAGNOSIS — Z01419 Encounter for gynecological examination (general) (routine) without abnormal findings: Secondary | ICD-10-CM

## 2013-07-29 DIAGNOSIS — N912 Amenorrhea, unspecified: Secondary | ICD-10-CM

## 2013-07-29 DIAGNOSIS — Z113 Encounter for screening for infections with a predominantly sexual mode of transmission: Secondary | ICD-10-CM

## 2013-07-29 LAB — POCT URINALYSIS DIPSTICK
Blood, UA: NEGATIVE
Glucose, UA: NEGATIVE
Urobilinogen, UA: NEGATIVE

## 2013-07-29 NOTE — Patient Instructions (Signed)

## 2013-07-29 NOTE — Progress Notes (Signed)
Patient ID: Chelsey James, female   DOB: 06-Sep-1965, 47 y.o.   MRN: [id number] 47 y.o. G0P0 Single Caucasian Fe here for annual exam. Current amenorrhea.  Last Provera challenge in March without vaginal bleed. Last FSH 44.2 on 12/03/12.  She thinks maybe she had 1 cycle since then but unsure.  She and fiance are now split up.  He is moving out now.  She is now feeling less stress.  No LMP recorded. Patient is not currently having periods (Reason: Perimenopausal).          Sexually active: yes  The current method of family planning is Essure.    Exercising: yes  Gym/ health club routine includes tae kwondo. Smoker:  no  Health Maintenance: Pap:  07/27/12, WNL, neg HR HPV MMG:  05/17/13, Bi-Rads 1: negative Colonoscopy: no colon TDaP:  UTD Labs:  HB:  13.4 Urine:  Negative, pH 6.0   reports that she has never smoked. She has never used smokeless tobacco. She reports that she does not drink alcohol or use illicit drugs.  Past Medical History  Diagnosis Date  . Crohn's   . Anemia   . Inflammatory arthritis 06/2009  . STD (sexually transmitted disease) 8/04    pos HSV 2 serology  . Diabetes mellitus without complication 11/09  . Hypertension 11/09    Past Surgical History  Procedure Laterality Date  . Breast reduction surgery Bilateral 1991  . Tonsillectomy  child  . Tubal ligation    . Colon resection  10/1998  . Colposcopy  ~1990    cx dysplasia  . Colectomy  11/08    partial   . Essure tubal ligation  3/11    in office  . Shoulder arthroscopy w/ acromial repair Right 09/2011    and removal of bone spur  . Partial colectomy  11/08    J Pouch inserted  . Appendectomy  4/04    laparoscopic  . Essure tubal ligation Bilateral 10/2009    Current Outpatient Prescriptions  Medication Sig Dispense Refill  . b complex vitamins tablet Take 1 tablet by mouth daily.        Marland Kitchen loperamide (IMODIUM) 2 MG capsule Take 2 mg by mouth 4 (four) times daily as needed. For constipation        . methotrexate (RHEUMATREX) 2.5 MG tablet Take 7 tablets by mouth once a week.      . Multiple Vitamin (MULTIVITAMIN) tablet Take 1 tablet by mouth daily.         No current facility-administered medications for this visit.    Family History  Problem Relation Age of Onset  . Adopted: Yes  . Diabetes Maternal Grandmother   . Diabetes Maternal Grandfather   . Diabetes Paternal Grandmother   . Diabetes Paternal Grandfather     ROS:  Pertinent items are noted in HPI.  Otherwise, a comprehensive ROS was negative.  Exam:   BP 130/92  Pulse 72  Resp 16  Ht 5' 1.75" (1.568 m)  Wt 166 lb (75.297 kg)  BMI 30.63 kg/m2 Height: 5' 1.75" (156.8 cm)  Ht Readings from Last 3 Encounters:  07/29/13 5' 1.75" (1.568 m)  05/27/11 5\' 1"  (1.549 m)    General appearance: alert, cooperative and appears stated age Head: Normocephalic, without obvious abnormality, atraumatic Neck: no adenopathy, supple, symmetrical, trachea midline and thyroid normal to inspection and palpation Lungs: clear to auscultation bilaterally Breasts: normal appearance, no masses or tenderness Heart: regular rate and rhythm Abdomen: soft, non-tender; no  masses,  no organomegaly Extremities: extremities normal, atraumatic, no cyanosis or edema Skin: Skin color, texture, turgor normal. No rashes or lesions Lymph nodes: Cervical, supraclavicular, and axillary nodes normal. No abnormal inguinal nodes palpated Neurologic: Grossly normal   Pelvic: External genitalia:  no lesions              Urethra:  normal appearing urethra with no masses, tenderness or lesions              Bartholin's and Skene's: normal                 Vagina: normal appearing vagina with normal color and discharge, no lesions              Cervix: anteverted              Pap taken: yes Bimanual Exam:  Uterus:  normal size, contour, position, consistency, mobility, non-tender              Adnexa: no mass, fullness, tenderness                Rectovaginal: deferred               Anus:  Declined by patient secondary to previous surgery  A:  Well Woman with normal exam  Amenorrhea  Perimenopausal  R/O STD's, history of HSV I/ II  DM, HTN  Vit D deficiency  S/P office Essure  S/P colectomy 11/08  History of Inflammatory Arthritis on Rheumatrex    P:   Pap smear as per guidelines Done today  Mammogram due 10/15  Follow with labs  Counseled on breast self exam, mammography screening, adequate intake of calcium and vitamin D, diet and exercise return annually or prn  An After Visit Summary was printed and given to the patient.

## 2013-07-30 ENCOUNTER — Encounter: Payer: Self-pay | Admitting: Nurse Practitioner

## 2013-07-30 LAB — STD PANEL: HIV: NONREACTIVE

## 2013-07-30 LAB — VITAMIN D 25 HYDROXY (VIT D DEFICIENCY, FRACTURES): Vit D, 25-Hydroxy: 50 ng/mL (ref 30–89)

## 2013-07-30 NOTE — Progress Notes (Signed)
Reviewed personally.  M. Suzanne Makensie Mulhall, MD.  

## 2013-08-04 LAB — IPS N GONORRHOEA AND CHLAMYDIA BY PCR

## 2013-08-04 LAB — IPS PAP TEST WITH HPV

## 2013-12-07 ENCOUNTER — Telehealth: Payer: Self-pay | Admitting: Nurse Practitioner

## 2013-12-07 NOTE — Telephone Encounter (Signed)
Pt was diagnosed as being in menopause in December but she just had a period.

## 2013-12-07 NOTE — Telephone Encounter (Signed)
Spoke with patient. Patient states that in Dec of last year labs were drawn and she was told she was in menopause and to call if she had any further bleeding. Patient states that she began bleeding last Thursday and is still bleeding today. Patient states "It is not a lot of blood or anything. It is just like my normal period. It started off heavy the first day then went to normal bleeding and it is almost gone now. I could not remember if [name], FNP said I may have one more period or not." Patient denies pain. Advised would like patient to come in for office visit with MD. Chelsey James office visit today. Patient declines stating that she can not come in due to work schedule until Friday. Would like to know if she needs to be bleeding at visit. Advised we would just like to see patient as soon as possible for exam. Patient states will not be able to get out of work until 7 until Friday and can not come sooner. Patient requesting Friday appointment. Appointment scheduled with Dr.Lathrop at 1400 on Friday May 1st. Advised message would be sent to providers and if any further instructions would call patient back. Patient agreeable.   Routing to [employer], FNP CC: Dr.Lathrop

## 2013-12-07 NOTE — Telephone Encounter (Signed)
Lets have her to do a menses calendar and in 3 months if no cycle to call back. She is not 1 year past her last LMP.  However her last [place] was 103.  So have her monitor and call back.

## 2013-12-07 NOTE — Telephone Encounter (Signed)
Spoke with patient. Message from [name], [place] given as seen below. Patient is agreeable and verbalizes understanding. Will call back in 3 months if no menses.  Routing to provider for final review. Patient agreeable to disposition. Will close encounter.  CC: Dr.Lathrop as Juluis Rainier of appointment cancellation

## 2013-12-10 ENCOUNTER — Ambulatory Visit: Payer: 59 | Admitting: Gynecology

## 2013-12-24 ENCOUNTER — Telehealth: Payer: Self-pay | Admitting: Nurse Practitioner

## 2013-12-24 NOTE — Telephone Encounter (Signed)
Call to patient, advised of Dr Ammie Ferrier review and recommendation for OV in order for [place] to evaluate and treat her correctly.  Patient states she will have to call back to schedule, I offered to make appointment but she said she does not have calendar with her. Will call back to schedule.  Encounter closed.

## 2013-12-24 NOTE — Telephone Encounter (Signed)
Epic and previous chart reviewed.  Last treated for BV 01-25-2011. STD testing 07-2013 at [facility]. Patient had breakup with fiancee noted at last appointment.  Previous chart to your office.

## 2013-12-24 NOTE — Telephone Encounter (Signed)
Needs OV.  

## 2013-12-24 NOTE — Telephone Encounter (Signed)
Spoke with patient. Patient states that she is experiencing a "fishy odor" that has been going on for two days. Denies discharge, abdominal pain, fever, and urinary symptoms. States that this has happened before and [name], FNP is aware. "I had sex a couple of days ago and I was really dry and now I am having the fishy odor. This has happened before and it was BV." Advised patient would need to be seen in office for evaluation. Patient declines appointment stating "I do not need an appointment. I already know what it is and I just need a prescription called in." Advised we do not treat over the phone and require an appointment before treatment. "I do not want to come in when I already know what it is. I have talked to [facility] about this before and she is aware that this happens to me." Advised [name], FNP is out of the office today but I would send a message over to the covering provider and give patient a call back with any further instructions or recommendations. Patient agreeable.  Routing to [place] as covering. CC: [name], [place]

## 2013-12-24 NOTE — Telephone Encounter (Signed)
Patient calling stating she has "vaginosis." She declined an appointment and requests something be called in. [facility] [place]

## 2014-01-14 ENCOUNTER — Telehealth: Payer: Self-pay | Admitting: Nurse Practitioner

## 2014-01-14 NOTE — Telephone Encounter (Signed)
She needs OV to assess.  She may need endo biopsy.  I will route this note to Dr. Quincy Simmonds and see she agrees.  i think the confusion is from my last note she had a cycle in early 2014 and ? If another since then.  Then you state she had one in March 2015??

## 2014-01-14 NOTE — Telephone Encounter (Signed)
Patient was told to call if she started bleeding again.  °

## 2014-01-14 NOTE — Telephone Encounter (Signed)
Spoke with pt who noticed some light bleeding this morning and wanted to call right away, like she was instructed to do.  Pt is wearing pantyliner. Per last phone notes, pt has not gone 12 months without a cycle yet, and was told to keep menses calendar and call if no cycle for 3 months. Pt had bleeding in April of this year. Patty, does pt need to be seen for eval or have labwork done, or just continue to keep menses calendar?

## 2014-01-17 ENCOUNTER — Other Ambulatory Visit: Payer: Self-pay | Admitting: Nurse Practitioner

## 2014-01-17 DIAGNOSIS — N950 Postmenopausal bleeding: Secondary | ICD-10-CM

## 2014-01-17 NOTE — Telephone Encounter (Signed)
Can you please schedule this appointment with Dr. Quincy Simmonds who may do endo biopsy at same time.

## 2014-01-17 NOTE — Telephone Encounter (Signed)
As there is confusion about the bleeding, I recommend an office visit to clarify further and potentially do an endometrial biopsy.  A pelvic ultrasound may be indicated.  I recommend precerting the endometrial biopsy.

## 2014-01-18 ENCOUNTER — Telehealth: Payer: Self-pay | Admitting: Obstetrics and Gynecology

## 2014-01-18 NOTE — Telephone Encounter (Signed)
Spoke with patient. Advised of out of pocket expense if the EMB is performed. She stated that this may be a problem and that she will call us back.

## 2014-01-18 NOTE — Telephone Encounter (Signed)
Patient calling to let Gabriel Cirri know she checked with her "flexible spending account and everything is fine."

## 2014-01-18 NOTE — Telephone Encounter (Signed)
If we decide to do an endometrial biopsy, I can give the patient a paracervical block, numbing shots in her cervix, to reduce discomfort.  The injections feel like a buttock cramp.  This usually helps a lot!

## 2014-01-18 NOTE — Telephone Encounter (Signed)
Patient has some question regarding previous [place] results. Patient is asking for results.

## 2014-01-18 NOTE — Telephone Encounter (Signed)
Spoke with patient. Patient requesting to know what her last [facility] level was. Advised patient that last [facility] in Dec 2014 was 103 and post menopausal range is 23-116.3. Patient agreeable. Patient states that last Wednesday her breasts began to get really tender and on Friday she began to have her cycle. Patient is still bleeding. Blood is bright red in color and patient states "It is increased from what my normal period was." Patient is changing pad/tampon 2-3 times per day. LMP was on April 23rd-28th which was also bright red in color. Patient is scheduled for office visit with Dr.Silva and possible EMB on Friday at 10:00am. Patient states that she has spoken to a friend who had an EMB done in our office who told her that it was extremely painful. Patient would like to know if there is anything else she can take instead of 800mg  of ibuprofen/motrin for the pain. Advised would send a message over to Dr.Silva and give patient a call back with further instructions and recommendations.

## 2014-01-18 NOTE — Telephone Encounter (Signed)
PR: $604.08

## 2014-01-18 NOTE — Telephone Encounter (Signed)
Spoke with patient and message from Chelsey Cage, FNP and Dr. Quincy Simmonds given. She is agreeable to office visit. She declines earlier evaluation due to work/family commitments. Pre-procedure instructions given. Motrin instructions given. Motrin=Advil=Ibuprofen Can take 800 mg (Can purchase over the counter, you will need four 200 mg pills). Take with food. Make sure to eat a meal and drink fluids prior to appointment.  She is agreeable that she will be examined and then possible endometrial bx.   Patient agreeable.  Advised will precert and will call with benefits coverage.

## 2014-01-19 NOTE — Telephone Encounter (Signed)
Spoke with patient. Advised of message from [place]. Patient states that the more she was thinking last night the more she does not want to take ibuprofen or motrin because she has chron's disease and she was told that she should not take ibuprofen or motrin. Patient states that she would rather have the paracervical block. Advised patient that if EMB is something that needs to be done on Friday that we could do a paracervical block to help reduce the discomfort. Patient is agreeable and verbalizes understanding.  Routing to provider for final review. Patient agreeable to disposition. Will close encounter

## 2014-01-21 ENCOUNTER — Encounter: Payer: Self-pay | Admitting: Obstetrics and Gynecology

## 2014-01-21 ENCOUNTER — Ambulatory Visit (INDEPENDENT_AMBULATORY_CARE_PROVIDER_SITE_OTHER): Payer: 59 | Admitting: Obstetrics and Gynecology

## 2014-01-21 VITALS — BP 100/80 | HR 72 | Resp 18 | Ht 61.75 in | Wt 178.0 lb

## 2014-01-21 DIAGNOSIS — N926 Irregular menstruation, unspecified: Secondary | ICD-10-CM

## 2014-01-21 DIAGNOSIS — N950 Postmenopausal bleeding: Secondary | ICD-10-CM

## 2014-01-21 LAB — POCT URINE PREGNANCY: PREG TEST UR: NEGATIVE

## 2014-01-21 NOTE — Progress Notes (Signed)
GYNECOLOGY  VISIT   HPI: 48 y.o.   Single  Caucasian  female   G0P0 with No LMP recorded. Patient is not currently having periods (Reason: Perimenopausal).   here for   On and Off Vaginal Bleeding  Has never gone a full 12 months without a cycle. Had 1 - 2 menses between 2013 and December 2014 visit.   Had a menses in April 2015 - 4/23 - 4/28, heavy red flow.  No menses in May 2015.  Bled again on 6/5 - until now.   [place] 44.2 on 12/03/12 FSH  103.0 on 07/29/13.  History of cervical dysplasia many years ago.   Has Crohn's disease and a J pouch.   Has a rescue dog and causing bruising of patient.   Patient is worried about pain and anxiety related to a potential endometrial biopsy.   Urine UPT: Neg  GYNECOLOGIC HISTORY: No LMP recorded. Patient is not currently having periods (Reason: Perimenopausal). Contraception:  Essure  TL Menopausal hormone therapy: No        OB History   Grav Para Term Preterm Abortions TAB SAB Ect Mult Living   0 0                 There are no active problems to display for this patient.   Past Medical History  Diagnosis Date  . Crohn's   . Anemia   . Inflammatory arthritis 06/2009  . STD (sexually transmitted disease) 8/04    pos HSV 2 serology  . Diabetes mellitus without complication 64/40  . Hypertension 11/09    Past Surgical History  Procedure Laterality Date  . Breast reduction surgery Bilateral 1991  . Tonsillectomy  child  . Colon resection  10/1998  . Colposcopy  ~1990    cx dysplasia  . Colectomy  11/08    partial   . Shoulder arthroscopy w/ acromial repair Right 09/2011    and removal of bone spur  . Partial colectomy  11/08    J Pouch inserted  . Appendectomy  4/04    laparoscopic  . Essure tubal ligation Bilateral 10/2009    in office    Current Outpatient Prescriptions  Medication Sig Dispense Refill  . methotrexate (RHEUMATREX) 2.5 MG tablet Take 7 tablets by mouth once a week.      Marland Kitchen b complex vitamins tablet  Take 1 tablet by mouth daily.        . folic acid (FOLVITE) 1 MG tablet Take 1 mg by mouth daily.       Marland Kitchen loperamide (IMODIUM) 2 MG capsule Take 2 mg by mouth 4 (four) times daily as needed. For constipation       . Multiple Vitamin (MULTIVITAMIN) tablet Take 1 tablet by mouth daily.         No current facility-administered medications for this visit.     ALLERGIES: Ciprofloxacin and Valium  Family History  Problem Relation Age of Onset  . Adopted: Yes  . Diabetes Maternal Grandmother   . Diabetes Maternal Grandfather   . Diabetes Paternal Grandmother   . Diabetes Paternal Grandfather     History   Social History  . Marital Status: Single    Spouse Name: N/A    Number of Children: N/A  . Years of Education: N/A   Occupational History  . Not on file.   Social History Main Topics  . Smoking status: Never Smoker   . Smokeless tobacco: Never Used  . Alcohol Use: No  .  Drug Use: No  . Sexual Activity: No   Other Topics Concern  . Not on file   Social History Narrative  . No narrative on file    ROS:  Pertinent items are noted in HPI.  PHYSICAL EXAMINATION:    BP 100/80  Pulse 72  Resp 18  Ht 5' 1.75" (1.568 m)  Wt 178 lb (80.74 kg)  BMI 32.84 kg/m2     General appearance: alert, cooperative and appears stated age Abdomen: soft, non-tender; no masses,  no organomegaly No abnormal inguinal nodes palpated  Pelvic: External genitalia:  no lesions              Urethra:  normal appearing urethra with no masses, tenderness or lesions              Bartholins and Skenes: normal                 Vagina: normal appearing vagina with normal color and discharge, no lesions              Cervix: normal appearance.  Small amount of vaginal bleeding from os.                    Bimanual Exam:  Uterus:  uterus is normal size, shape, consistency and nontender                                      Adnexa: normal adnexa in size, nontender and no masses                                        ASSESSMENT  Irregular menses.  Elevated FSH but has not gone one year without a cycle.  Status post Essure.  Nervious about procedures.    PLAN  Check UPT, FSH, estradiol. If elevated FSH, will proceed with sonohysterogram and endometrial biopsy.  I discussed paracervical block and Xanax preprocedure.  Patient understand she would need to sign her consent form in advance and to have a driver to take her home afterward.    An After Visit Summary was printed and given to the patient.  _25_____ minutes face to face time of which over 50% was spent in counseling.

## 2014-01-22 LAB — FOLLICLE STIMULATING HORMONE: FSH: 34.1 m[IU]/mL

## 2014-01-22 LAB — ESTRADIOL: ESTRADIOL: 36.8 pg/mL

## 2014-02-02 ENCOUNTER — Telehealth: Payer: Self-pay | Admitting: Obstetrics and Gynecology

## 2014-02-02 NOTE — Telephone Encounter (Signed)
Patient said she was told that results from bloodwork would be on Monday. Has not received a call

## 2014-02-02 NOTE — Telephone Encounter (Signed)
Patient Result Comments    Entered by Jamey Reas de [place] Barban Sil* at 01/23/2014 4:37 PM   Ms. Minkler,   Your hormonal testing indicates you are not in menopause. Your bleeding really does correlate with menstruation.   Please keep a bleeding calendar so that we can follow your cycles if we ever have to look back again.   No pelvic ultrasound or endometrial biopsies are needed.   If you develop vaginal bleeding after going 12 months with no cycle or if you develop really heavy or prolonged bleeding, we will evaluate further.   Thanks,   Josefa Half, MD   Returned call from patient and message via [place] reviewed with patient.  She is agreeable to instructions from Dr. Quincy Simmonds and will follow up prn.  Routing to provider for final review. Patient agreeable to disposition. Will close encounter

## 2014-03-24 DIAGNOSIS — M0760 Enteropathic arthropathies, unspecified site: Secondary | ICD-10-CM | POA: Insufficient documentation

## 2014-05-05 ENCOUNTER — Other Ambulatory Visit: Payer: Self-pay | Admitting: Nurse Practitioner

## 2014-05-05 DIAGNOSIS — Z9889 Other specified postprocedural states: Secondary | ICD-10-CM

## 2014-05-05 DIAGNOSIS — Z1231 Encounter for screening mammogram for malignant neoplasm of breast: Secondary | ICD-10-CM

## 2014-05-18 ENCOUNTER — Ambulatory Visit
Admission: RE | Admit: 2014-05-18 | Discharge: 2014-05-18 | Disposition: A | Discharge status: Home / Self Care | Payer: 59 | Source: Ambulatory Visit | Attending: Nurse Practitioner | Admitting: Nurse Practitioner

## 2014-05-18 DIAGNOSIS — Z1231 Encounter for screening mammogram for malignant neoplasm of breast: Secondary | ICD-10-CM

## 2014-05-18 DIAGNOSIS — Z9889 Other specified postprocedural states: Secondary | ICD-10-CM

## 2014-05-30 ENCOUNTER — Emergency Department (HOSPITAL_BASED_OUTPATIENT_CLINIC_OR_DEPARTMENT_OTHER)
Admission: EM | Admit: 2014-05-30 | Discharge: 2014-05-30 | Disposition: A | Discharge status: Home / Self Care | Payer: 59 | Attending: Emergency Medicine | Admitting: Emergency Medicine

## 2014-05-30 ENCOUNTER — Encounter (HOSPITAL_BASED_OUTPATIENT_CLINIC_OR_DEPARTMENT_OTHER): Payer: Self-pay | Admitting: Emergency Medicine

## 2014-05-30 ENCOUNTER — Emergency Department (HOSPITAL_BASED_OUTPATIENT_CLINIC_OR_DEPARTMENT_OTHER): Payer: 59

## 2014-05-30 DIAGNOSIS — Z3202 Encounter for pregnancy test, result negative: Secondary | ICD-10-CM | POA: Insufficient documentation

## 2014-05-30 DIAGNOSIS — R1032 Left lower quadrant pain: Secondary | ICD-10-CM | POA: Insufficient documentation

## 2014-05-30 DIAGNOSIS — R1031 Right lower quadrant pain: Secondary | ICD-10-CM | POA: Diagnosis not present

## 2014-05-30 DIAGNOSIS — Z9889 Other specified postprocedural states: Secondary | ICD-10-CM | POA: Insufficient documentation

## 2014-05-30 DIAGNOSIS — Z9089 Acquired absence of other organs: Secondary | ICD-10-CM | POA: Insufficient documentation

## 2014-05-30 DIAGNOSIS — Z8739 Personal history of other diseases of the musculoskeletal system and connective tissue: Secondary | ICD-10-CM | POA: Insufficient documentation

## 2014-05-30 DIAGNOSIS — R509 Fever, unspecified: Secondary | ICD-10-CM | POA: Diagnosis present

## 2014-05-30 DIAGNOSIS — R51 Headache: Secondary | ICD-10-CM | POA: Insufficient documentation

## 2014-05-30 DIAGNOSIS — K5090 Crohn's disease, unspecified, without complications: Secondary | ICD-10-CM | POA: Diagnosis not present

## 2014-05-30 DIAGNOSIS — Z79899 Other long term (current) drug therapy: Secondary | ICD-10-CM | POA: Diagnosis not present

## 2014-05-30 DIAGNOSIS — Z9851 Tubal ligation status: Secondary | ICD-10-CM | POA: Diagnosis not present

## 2014-05-30 DIAGNOSIS — D649 Anemia, unspecified: Secondary | ICD-10-CM | POA: Insufficient documentation

## 2014-05-30 DIAGNOSIS — E119 Type 2 diabetes mellitus without complications: Secondary | ICD-10-CM | POA: Insufficient documentation

## 2014-05-30 DIAGNOSIS — Z8619 Personal history of other infectious and parasitic diseases: Secondary | ICD-10-CM | POA: Diagnosis not present

## 2014-05-30 DIAGNOSIS — R109 Unspecified abdominal pain: Secondary | ICD-10-CM

## 2014-05-30 LAB — URINALYSIS, ROUTINE W REFLEX MICROSCOPIC
BILIRUBIN URINE: NEGATIVE
GLUCOSE, UA: 100 mg/dL — AB
HGB URINE DIPSTICK: NEGATIVE
KETONES UR: 15 mg/dL — AB
Leukocytes, UA: NEGATIVE
Nitrite: NEGATIVE
PH: 6 (ref 5.0–8.0)
PROTEIN: 100 mg/dL — AB
Specific Gravity, Urine: 1.015 (ref 1.005–1.030)
Urobilinogen, UA: 0.2 mg/dL (ref 0.0–1.0)

## 2014-05-30 LAB — BASIC METABOLIC PANEL
Anion gap: 18 — ABNORMAL HIGH (ref 5–15)
BUN: 10 mg/dL (ref 6–23)
CALCIUM: 9.9 mg/dL (ref 8.4–10.5)
CO2: 24 meq/L (ref 19–32)
CREATININE: 0.8 mg/dL (ref 0.50–1.10)
Chloride: 91 mEq/L — ABNORMAL LOW (ref 96–112)
GFR calc Af Amer: >90 mL/min (ref >90)
GFR, EST NON AFRICAN AMERICAN: 86 mL/min — AB (ref >90)
GLUCOSE: 267 mg/dL — AB (ref 70–99)
Potassium: 4.1 mEq/L (ref 3.7–5.3)
SODIUM: 133 meq/L — AB (ref 137–147)

## 2014-05-30 LAB — URINE MICROSCOPIC-ADD ON

## 2014-05-30 LAB — CBC WITH DIFFERENTIAL/PLATELET
Basophils Absolute: 0.1 10*3/uL (ref 0.0–0.1)
Basophils Relative: 1 % (ref 0–1)
EOS ABS: 0.1 10*3/uL (ref 0.0–0.7)
Eosinophils Relative: 0 % (ref 0–5)
HEMATOCRIT: 41.2 % (ref 36.0–46.0)
Hemoglobin: 13.3 g/dL (ref 12.0–15.0)
LYMPHS ABS: 3.3 10*3/uL (ref 0.7–4.0)
LYMPHS PCT: 27 % (ref 12–46)
MCH: 30.4 pg (ref 26.0–34.0)
MCHC: 32.3 g/dL (ref 30.0–36.0)
MCV: 94.1 fL (ref 78.0–100.0)
MONO ABS: 2.6 10*3/uL — AB (ref 0.1–1.0)
Monocytes Relative: 21 % — ABNORMAL HIGH (ref 3–12)
Neutro Abs: 6.2 10*3/uL (ref 1.7–7.7)
Neutrophils Relative %: 51 % (ref 43–77)
PLATELETS: 231 10*3/uL (ref 150–400)
RBC: 4.38 MIL/uL (ref 3.87–5.11)
RDW: 13.1 % (ref 11.5–15.5)
WBC: 12.3 10*3/uL — AB (ref 4.0–10.5)

## 2014-05-30 LAB — PREGNANCY, URINE: Preg Test, Ur: NEGATIVE

## 2014-05-30 MED ORDER — SODIUM CHLORIDE 0.9 % IV BOLUS (SEPSIS)
1000.0000 mL | Freq: Once | INTRAVENOUS | Status: AC
  Administered 2014-05-30: 1000 mL via INTRAVENOUS

## 2014-05-30 MED ORDER — ACETAMINOPHEN 325 MG PO TABS
650.0000 mg | ORAL_TABLET | Freq: Once | ORAL | Status: AC
  Administered 2014-05-30: 650 mg via ORAL
  Filled 2014-05-30: qty 2

## 2014-05-30 MED ORDER — IOHEXOL 300 MG/ML  SOLN
100.0000 mL | Freq: Once | INTRAMUSCULAR | Status: AC | PRN
  Administered 2014-05-30: 100 mL via INTRAVENOUS

## 2014-05-30 NOTE — ED Notes (Signed)
MD at bedside. 

## 2014-05-30 NOTE — ED Notes (Signed)
Fever x 3-4 days, body aches

## 2014-05-30 NOTE — ED Notes (Signed)
Pt given oral contrast by CT staff.

## 2014-05-30 NOTE — ED Provider Notes (Signed)
CSN: [id number]     Arrival date & time 05/30/14  1820 History  This chart was scribed for Debby Freiberg, MD by Tula Nakayama, ED Scribe. This patient was seen in room MH07/MH07 and the patient's care was started at 7:18 PM.   Chief Complaint  Patient presents with  . Fever   The history is provided by the patient. No language interpreter was used.   HPI Comments: Chelsey James is a 48 y.o. female with a history of Chron's Disease, DM, arthritis and appendectomy, who presents to the Emergency Department complaining of constant fever and generalized body aches that started 3-4 days ago.  Tmax 103. She has fever of 101.5 in the ED. Pt has mild headache and worse-than-baseline knee pain as associated symptoms.  Timing of fever is constant and the pt has taken tylenol, but no other medication She denies cough, congestion, pain with urination, and rash.  No change in bowel movements.  Pt was advised not to get a flu shot because she takes Simponi for arthritis.  Past Medical History  Diagnosis Date  . Crohn's   . Anemia   . Inflammatory arthritis 06/2009  . STD (sexually transmitted disease) 8/04    pos HSV 2 serology  . Diabetes mellitus without complication 57/84   Past Surgical History  Procedure Laterality Date  . Breast reduction surgery Bilateral 1991  . Tonsillectomy  child  . Colon resection  10/1998  . Colposcopy  ~1990    cx dysplasia  . Colectomy  11/08    partial   . Shoulder arthroscopy w/ acromial repair Right 09/2011    and removal of bone spur  . Partial colectomy  11/08    J Pouch inserted  . Appendectomy  4/04    laparoscopic  . Essure tubal ligation Bilateral 10/2009    in office   Family History  Problem Relation Age of Onset  . Adopted: Yes  . Diabetes Maternal Grandmother   . Diabetes Maternal Grandfather   . Diabetes Paternal Grandmother   . Diabetes Paternal Grandfather    History  Substance Use Topics  . Smoking status: Never Smoker   .  Smokeless tobacco: Never Used  . Alcohol Use: No   OB History   Grav Para Term Preterm Abortions TAB SAB Ect Mult Living   0 0             Review of Systems  Constitutional: Positive for fever.  HENT: Negative for congestion.   Respiratory: Negative for cough.   Gastrointestinal: Negative for nausea and vomiting.  Genitourinary: Negative for dysuria.  Musculoskeletal: Positive for arthralgias.  Skin: Negative for color change.  Neurological: Positive for headaches.  All other systems reviewed and are negative.   Allergies  Ciprofloxacin and Valium  Home Medications   Prior to Admission medications   Medication Sig Start Date End Date Taking? Authorizing Provider  Golimumab (SIMPONI ) Inject into the skin.   Yes Historical Provider, MD  b complex vitamins tablet Take 1 tablet by mouth daily.      Historical Provider, MD  folic acid (FOLVITE) 1 MG tablet Take 1 mg by mouth daily.  12/22/13   Historical Provider, MD  loperamide (IMODIUM) 2 MG capsule Take 2 mg by mouth 4 (four) times daily as needed. For constipation     Historical Provider, MD  methotrexate (RHEUMATREX) 2.5 MG tablet Take 7 tablets by mouth once a week. 07/23/13   Historical Provider, MD  Multiple Vitamin (MULTIVITAMIN) tablet  Take 1 tablet by mouth daily.      Historical Provider, MD   BP 113/76  Pulse 87  Temp(Src) 99.2 F (37.3 C) (Oral)  Resp 16  Ht 5\' 1"  (1.549 m)  Wt 163 lb (73.936 kg)  BMI 30.81 kg/m2  SpO2 96% Physical Exam  Nursing note and vitals reviewed. Constitutional: She appears well-developed and well-nourished. No distress.  HENT:  Head: Normocephalic and atraumatic.  Eyes: Conjunctivae and EOM are normal.  Neck: Neck supple. No tracheal deviation present. No Brudzinski's sign and no Kernig's sign noted.  Cardiovascular: Normal rate.   Pulmonary/Chest: Effort normal. No respiratory distress.  Abdominal: There is tenderness in the right lower quadrant, suprapubic area and left lower  quadrant.  Musculoskeletal:       Right knee: She exhibits normal range of motion, no swelling, no ecchymosis and no erythema.       Left knee: She exhibits normal range of motion, no swelling, no ecchymosis and no erythema.       Cervical back: Normal.       Thoracic back: Normal.       Lumbar back: Normal.  Skin: Skin is warm and dry.  Psychiatric: She has a normal mood and affect. Her behavior is normal.    ED Course  Procedures (including critical care time)  DIAGNOSTIC STUDIES: Oxygen Saturation is 95% on RA, adequate by my interpretation.    COORDINATION OF CARE: 7:26 PM Discussed treatment plan with pt at bedside and pt agreed to plan.  Labs Review Labs Reviewed  CBC WITH DIFFERENTIAL - Abnormal; Notable for the following:    WBC 12.3 (*)    Monocytes Relative 21 (*)    Monocytes Absolute 2.6 (*)    All other components within normal limits  BASIC METABOLIC PANEL - Abnormal; Notable for the following:    Sodium 133 (*)    Chloride 91 (*)    Glucose, Bld 267 (*)    GFR calc non Af Amer 86 (*)    Anion gap 18 (*)    All other components within normal limits  URINALYSIS, ROUTINE W REFLEX MICROSCOPIC - Abnormal; Notable for the following:    Color, Urine AMBER (*)    Glucose, UA 100 (*)    Ketones, ur 15 (*)    Protein, ur 100 (*)    All other components within normal limits  CULTURE, BLOOD (ROUTINE X 2)  CULTURE, BLOOD (ROUTINE X 2)  PREGNANCY, URINE  URINE MICROSCOPIC-ADD ON    Imaging Review Dg Chest 2 View  05/30/2014   CLINICAL DATA:  Fever.  EXAM: CHEST  2 VIEW  COMPARISON:  None.  FINDINGS: The heart size and mediastinal contours are within normal limits. Both lungs are clear. The visualized skeletal structures are unremarkable.  IMPRESSION: No active cardiopulmonary disease.   Electronically Signed   By: Curlene Dolphin M.D.   On: 05/30/2014 21:05   Ct Abdomen Pelvis W Contrast  05/30/2014   CLINICAL DATA:  48 year old female with acute fever. Initial  encounter. Personal history of Crohn disease status post colectomy and J-pouch creation in 2008.  EXAM: CT ABDOMEN AND PELVIS WITH CONTRAST  TECHNIQUE: Multidetector CT imaging of the abdomen and pelvis was performed using the standard protocol following bolus administration of intravenous contrast.  CONTRAST:  16mL OMNIPAQUE IOHEXOL 300 MG/ML  SOLN  COMPARISON:  CT Abdomen and Pelvis 05/27/2011.  FINDINGS: Negative lung bases.  No pericardial or pleural effusion.  No acute osseous abnormality identified.  No  pelvic free fluid. Distal bowel anastomosis re- identified. Mild bowel wall thickening throughout the region of the anastomosis, extending toward the anal verge. Mild surrounding mesenteric stranding. Wall thickening up to 12 mm, which along the right aspect of the anastomosis is inseparable from the right adnexa. Increased perirectal lymph nodes (e.g. Series 2, image 79) and distal small bowel mesentery lymph nodes (image 65).  Uterus and left adnexal stable and within normal limits. Unremarkable bladder.  Upstream of the anastomosis mild bowel wall thickening gradually abating to normal appearing small bowel loops. No dilated loops. Oral contrast has nearly reached the anastomosis. Stomach is distended but otherwise within normal limits. Negative duodenum.  Moderate hepatic steatosis is not significantly changed. Negative gallbladder, spleen, pancreas, adrenal glands, portal venous system. Kidneys are stable and normal aside from small cysts. No abdominal free fluid. Major arterial structures in the abdomen and pelvis are patent. Aortoiliac calcified atherosclerosis noted.  IMPRESSION: 1. New circumferential bowel wall thickening diffusely affecting the J pouch and extending distally toward the anal verge. Inflamed J pouch inseparable from the right adnexa. 2. Associated reactive appearing mesenteric and perirectal lymphadenopathy. 3. Constellation is most compatible with acute/recurrent inflammatory bowel  disease. No associated bowel obstruction. 4. Stable moderate hepatic steatosis.   Electronically Signed   By: Lars Pinks M.D.   On: 05/30/2014 20:52     EKG Interpretation None      MDM   Final diagnoses:  Fever, unspecified fever cause  Abdominal pain, acute    48 y.o. female with pertinent PMH of Crohns and inflammatory arthritis on simponi presents with fever, malaise x 4 days.  No respiratory symptoms, change in bm, or urinary symptoms.  Also no pain in neck, back, or elsewhere.  On arrival vitals and physical exam as above.  Pt did have some abd tenderness.  Labs and CT scan as above obtained.  CT scan with nonspecific bowel inflammation.  No abscesses.  This is likely etiology of pt fever, however as she is on immunomodulating drugs, discussed strict return precautions for any change in symptoms, back pain, increasing ha, or fever unresponsive to antiinflammatories.  At this time I doubt occult infection such as epidural abscess, meningitis, encephalitis, or other pathology.  Blood cultures drawn.  DC home in stable condition  1. Fever, unspecified fever cause   2. Abdominal pain, acute            Debby Freiberg, MD 05/30/14 [id number]

## 2014-05-30 NOTE — Discharge Instructions (Signed)

## 2014-06-06 LAB — CULTURE, BLOOD (ROUTINE X 2)
CULTURE: NO GROWTH
Culture: NO GROWTH

## 2014-08-15 ENCOUNTER — Ambulatory Visit (INDEPENDENT_AMBULATORY_CARE_PROVIDER_SITE_OTHER): Payer: 59 | Admitting: Nurse Practitioner

## 2014-08-15 ENCOUNTER — Encounter: Payer: Self-pay | Admitting: Nurse Practitioner

## 2014-08-15 VITALS — BP 112/78 | HR 74 | Resp 16 | Ht 61.25 in | Wt 171.0 lb

## 2014-08-15 DIAGNOSIS — Z0000 Encounter for general adult medical examination without abnormal findings: Secondary | ICD-10-CM

## 2014-08-15 DIAGNOSIS — Z01419 Encounter for gynecological examination (general) (routine) without abnormal findings: Secondary | ICD-10-CM

## 2014-08-15 LAB — POCT URINALYSIS DIPSTICK
Bilirubin, UA: NEGATIVE
Blood, UA: NEGATIVE
Glucose, UA: 250
KETONES UA: NEGATIVE
Leukocytes, UA: NEGATIVE
Nitrite, UA: NEGATIVE
PH UA: 5
Protein, UA: NEGATIVE
Urobilinogen, UA: NEGATIVE

## 2014-08-15 NOTE — Progress Notes (Signed)
49 y.o. G0P0 Single Caucasian Fe here for annual exam. Now in a new relationship for 8 months.  He was a person she dated in high school.  No STD concerns.   Had a menses in June and repeat [facility] at 34.1.  [facility] last year 103.0 - 12/14.)  Then had maybe 1 more cycles since then.  Per Dr. Quincy Simmonds did not need PUS at that time.  No menses since August.  Some vaso symptoms that are tolerable.  In past few weeks having some lower cramps and less vaso symptoms along with breast tenderness.   Thinks she may be getting another cycle soon.   Crohn is some better currently.  Liver enzymes were elevated on Methotrexate so was discontinued.  Then while off med's had a flare of Crohn.  Had a lot of diarrhea.   CT scan showed inflammation and went back to [facility]. Treated with antibiotics and Simponi once monthly IM.  Now working on weight reduction and decrease glucose.  Patient's last menstrual period was 02/09/2014.          Sexually active: Yes.    The current method of family planning is Essure.    Exercising: No.  exercise Smoker:  no  Health Maintenance: Pap: 07-29-13 neg HPV HR neg MMG: 05-18-14 category a density, birads 1:neg Colonoscopy: no colon BMD:   2008 TDaP:  unsure Labs: Poct urine-glucose 250 Self breast exam: done occ   reports that she has never smoked. She has never used smokeless tobacco. She reports that she drinks alcohol. She reports that she does not use illicit drugs.  Past Medical History  Diagnosis Date  . Crohn's   . Anemia   . Inflammatory arthritis 06/2009  . STD (sexually transmitted disease) 8/04    pos HSV 2 serology  . Diabetes mellitus without complication 40/34    Past Surgical History  Procedure Laterality Date  . Breast reduction surgery Bilateral 1991  . Tonsillectomy  child  . Colon resection  10/1998  . Colposcopy  ~1990    cx dysplasia  . Colectomy  11/08    partial   . Shoulder arthroscopy w/ acromial repair Right 09/2011    and removal of bone spur   . Partial colectomy  11/08    J Pouch inserted  . Appendectomy  4/04    laparoscopic  . Essure tubal ligation Bilateral 10/2009    in office    Current Outpatient Prescriptions  Medication Sig Dispense Refill  . Golimumab (SIMPONI [place]) Inject into the skin.    Marland Kitchen loperamide (IMODIUM) 2 MG capsule Take 2 mg by mouth 4 (four) times daily as needed. For constipation      No current facility-administered medications for this visit.    Family History  Problem Relation Age of Onset  . Adopted: Yes  . Diabetes Maternal Grandmother   . Diabetes Maternal Grandfather   . Diabetes Paternal Grandmother   . Diabetes Paternal Grandfather     ROS:  Pertinent items are noted in HPI.  Otherwise, a comprehensive ROS was negative.  Exam:   BP 112/78 mmHg  Pulse 74  Resp 16  Ht 5' 1.25" (1.556 m)  Wt 171 lb (77.565 kg)  BMI 32.04 kg/m2  LMP 02/09/2014 Height: 5' 1.25" (155.6 cm)  Ht Readings from Last 3 Encounters:  08/15/14 5' 1.25" (1.556 m)  05/30/14 5\' 1"  (1.549 m)  01/21/14 5' 1.75" (1.568 m)    General appearance: alert, cooperative and appears stated age  Head: Normocephalic, without obvious abnormality, atraumatic Neck: no adenopathy, supple, symmetrical, trachea midline and thyroid normal to inspection and palpation Lungs: clear to auscultation bilaterally Breasts: normal appearance, no masses or tenderness Heart: regular rate and rhythm Abdomen: soft, non-tender; no masses,  no organomegaly Extremities: extremities normal, atraumatic, no cyanosis or edema Skin: Skin color, texture, turgor normal. No rashes or lesions Lymph nodes: Cervical, supraclavicular, and axillary nodes normal. No abnormal inguinal nodes palpated Neurologic: Grossly normal   Pelvic: External genitalia:  no lesions except for a perianal area that looks raw or split secondary to SA.  Does not appear to be an HSV lesion.              Urethra:  normal appearing urethra with no masses, tenderness or  lesions              Bartholin's and Skene's: normal                 Vagina: normal appearing vagina with normal color and discharge, no lesions              Cervix: anteverted              Pap taken: Yes.   Bimanual Exam:  Uterus:  normal size, contour, position, consistency, mobility, non-tender              Adnexa: no mass, fullness, tenderness               Rectovaginal: Confirms               Anus:  normal sphincter tone, no lesions  A:  Well Woman with normal exam  Amenorrhea since July Perimenopausal History of HSV I/ II DM, HTN Vit D deficiency S/P office Essure S/P colectomy 11/08 History of Inflammatory Arthritis on Simponi  currently   P:   Reviewed health and wellness pertinent to exam  Pap smear taken today  Counseled on breast self exam, mammography screening, adequate intake of calcium and vitamin D, diet and exercise, Kegel's exercises return annually or prn  An After Visit Summary was printed and given to the patient.

## 2014-08-15 NOTE — Patient Instructions (Signed)

## 2014-08-16 NOTE — Progress Notes (Signed)
Encounter reviewed by Dr. Brook Silva.  

## 2014-08-17 DIAGNOSIS — Z79899 Other long term (current) drug therapy: Secondary | ICD-10-CM | POA: Insufficient documentation

## 2014-08-19 LAB — IPS PAP TEST WITH HPV

## 2014-12-07 ENCOUNTER — Telehealth: Payer: Self-pay | Admitting: Nurse Practitioner

## 2014-12-07 NOTE — Telephone Encounter (Signed)
Patient is having some problems, says she discussed with [place] at her last appointment 08/2014. Patient says her problems have gotten worse. Patient would like an appointment ASAP.

## 2014-12-07 NOTE — Telephone Encounter (Signed)
Spoke with patient. Patient states that she has "vaginal cuts" that will not go away. "I am so raw that is is so painful to even wipe. When I use the restroom it is terrible. It itches off an on too but not all the time. I have done some research and think it could be related to my Chrohns disease but before I go to [facility] I want to see if it is related to anything else." Patient denies any new products with laundry or placing any new products on vagina. Requesting an appointment for Friday 4/29 in the afternoon with [name], [place]. Appointment scheduled for 4/29 at 2:15pm. Patient is agreeable to date and time.  Routing to provider for final review. Patient agreeable to disposition. Will close encounter

## 2014-12-07 NOTE — Telephone Encounter (Signed)
Left message to call Kaitlyn at [phone]. 

## 2014-12-09 ENCOUNTER — Encounter: Payer: Self-pay | Admitting: Nurse Practitioner

## 2014-12-09 ENCOUNTER — Ambulatory Visit (INDEPENDENT_AMBULATORY_CARE_PROVIDER_SITE_OTHER): Payer: 59 | Admitting: Nurse Practitioner

## 2014-12-09 ENCOUNTER — Telehealth: Payer: Self-pay | Admitting: Nurse Practitioner

## 2014-12-09 VITALS — BP 118/78 | HR 72 | Resp 16 | Ht 61.25 in | Wt 169.0 lb

## 2014-12-09 DIAGNOSIS — B373 Candidiasis of vulva and vagina: Secondary | ICD-10-CM | POA: Diagnosis not present

## 2014-12-09 DIAGNOSIS — B3731 Acute candidiasis of vulva and vagina: Secondary | ICD-10-CM

## 2014-12-09 MED ORDER — FLUCONAZOLE 150 MG PO TABS: 150.0000 mg | ORAL_TABLET | Freq: Once | ORAL | Status: DC

## 2014-12-09 MED ORDER — NYSTATIN-TRIAMCINOLONE 100000-0.1 UNIT/GM-% EX OINT: 1.0000 "application " | TOPICAL_OINTMENT | Freq: Two times a day (BID) | CUTANEOUS | Status: DC

## 2014-12-09 MED ORDER — LIDOCAINE (ANORECTAL) 5 % EX CREA: 1.0000 "application " | TOPICAL_CREAM | Freq: Three times a day (TID) | CUTANEOUS | Status: DC

## 2014-12-09 MED ORDER — NYSTATIN 100000 UNIT/GM EX CREA: 1.0000 "application " | TOPICAL_CREAM | Freq: Two times a day (BID) | CUTANEOUS | Status: DC

## 2014-12-09 MED ORDER — TRIAMCINOLONE ACETONIDE 0.1 % EX CREA: 1.0000 "application " | TOPICAL_CREAM | Freq: Two times a day (BID) | CUTANEOUS | Status: DC

## 2014-12-09 NOTE — Telephone Encounter (Signed)
Patient is at the pharmacy and the prescriptions are too costly. Patient would like an alternative. Patient is waiting at the pharmacy.

## 2014-12-09 NOTE — Telephone Encounter (Signed)
New orders placed for Triamcinolone Cream (Kenalog) 0.1% Dispense 30 gram Apply topically bid and Nystatin Cream (Mycostatin) Dispense 30 gram Apply topically bid. Left message for patient at number provided [phone] advised new rx sent to pharmacy on file for patient and to return call with any further questions or concerns.  Routing to provider for final review. Patient agreeable to disposition. Will close encounter

## 2014-12-09 NOTE — Progress Notes (Signed)
49 y.o.Single White female G0 here with complaint of vaginal symptoms of itching, burning, and 'cuts'.  Describes discharge as minimal white.  No recent  SA, no recent change in personal products.  LMP 02/09/2014.  Lidocaine jelly used yesterday with some help. Onset of symptoms 4-5 days ago. She felt the symptoms may have been aggravated with recent flare of Crohn's.  Currently no diarrhea.    She denies any urinary symptoms unless the urine touches the outside labia.  Used Triamcinolone cream once yesterday without relief.    No STD concerns.  Contraception is BTL   O:Healthy female WDWN Affect: normal, orientation x 3  Exam: no distress, but uncomfortable Abdomen: soft and non tender Lymph node: no enlargement or tenderness Pelvic exam: External genital: normal female with multiple areas of linear cuts, redness all consistent with yeast dermatitis. BUS: negative Vagina: thin white discharge noted. Secondary to swelling and discomfort did not insert speculum very far.  The redness extends to the peri anal and the anal areas. Cervix: not examined Uterus: not examined Adnexa: not examined  A: History of Crohn's with recent flare  Multiple areas of yeast dermatitis   P: Discussed findings consistent with yeast and etiology. Discussed Aveeno or baking soda sitz bath for comfort. Avoid moist clothes or pads for extended period of time. If working out in gym clothes or swim suits for long periods of time change underwear or bottoms of swimsuit if possible. Olive Oil/Coconut Oil use for skin protection prior to activity can be used to external skin.  Rx: Diflucan 150 mg today and repeat in 24 hours.   Triamcinolone with Nystatin apply 2-3 times a day   Will recheck again on Monday   Call earlier if symptoms worsens.  RV prn

## 2014-12-09 NOTE — Telephone Encounter (Signed)
Spoke with patient advised of new orders placed and instructions given. Patient agreeable, she is advised to call back with any concerns or if symptoms continue.Marland Kitchen Routing to provider for final review. Patient agreeable to disposition. Will close encounter

## 2014-12-09 NOTE — Telephone Encounter (Signed)
Patient states the Rx needs written for two prescriptions to be covered instead of one.

## 2014-12-12 ENCOUNTER — Telehealth: Payer: Self-pay | Admitting: Nurse Practitioner

## 2014-12-12 ENCOUNTER — Ambulatory Visit: Payer: 59 | Admitting: Nurse Practitioner

## 2014-12-12 NOTE — Telephone Encounter (Signed)
Spoke with patient. Confirms she is feeling much improved. She would like to know how long to continue to use Mycostatin and Kenalog cream, advised to use for 7 days and return call if symptoms return or any other concern. Patient agreeable. Routing to provider for final review. Patient agreeable to disposition. Will close encounter

## 2014-12-12 NOTE — Telephone Encounter (Signed)
Pt called to cancel today's recheck. States she is feeling "significantly better". Pt does have a question regarding the creams she was prescribed. Pt would like to know who long she should continue to take them.

## 2014-12-17 NOTE — Progress Notes (Signed)
Encounter reviewed by Dr. Deaysia Grigoryan Silva.  

## 2014-12-28 ENCOUNTER — Telehealth: Payer: Self-pay | Admitting: Nurse Practitioner

## 2014-12-28 NOTE — Telephone Encounter (Signed)
Spoke with patient. She states that her initial symptoms have improved and feels yeast is improved. Now that swelling has lessened, she feels there has been changes in anatomy that she has concerns about. She is offered an appointment and accepts, she declines MD appointment, wishes to see [name], [place].  Patient is scheduled to see [name], [place] 12/29/14 at 1300. Routing to provider for final review. Patient agreeable to disposition. Will close encounter.

## 2014-12-28 NOTE — Telephone Encounter (Signed)
Pt states she is having additional concerns and symptoms since her last call (12/12/14). She would liked to be advised on treatment options or if she should schedule an in office appointment.

## 2014-12-29 ENCOUNTER — Encounter: Payer: Self-pay | Admitting: Nurse Practitioner

## 2014-12-29 ENCOUNTER — Ambulatory Visit (INDEPENDENT_AMBULATORY_CARE_PROVIDER_SITE_OTHER): Payer: 59 | Admitting: Nurse Practitioner

## 2014-12-29 VITALS — BP 120/78 | HR 84 | Resp 14 | Ht 61.25 in | Wt 170.0 lb

## 2014-12-29 DIAGNOSIS — N9089 Other specified noninflammatory disorders of vulva and perineum: Secondary | ICD-10-CM | POA: Diagnosis not present

## 2014-12-29 NOTE — Patient Instructions (Signed)
we will call you with apt. time

## 2014-12-29 NOTE — Progress Notes (Signed)
Subjective:     Patient ID: Chelsey James, female   DOB: 1966-04-17, 49 y.o.   MRN: [id number]  HPI 49 y.o.Single White female G0 here for a follow up on vaginal symptoms of itching, burning, and 'cuts'. she was seen on 4/29 with a bad yeast vaginitis that occurred after a flare of  Crohn's. States her symptoms are much better with only a few areas that are concerning to her with the continued 'cuts' that flare or tear open with wearing jeans.   She also has noted the "change of anatomy" in that her clitoris is no longer visible or as large.  No recent SA, no recent change in personal products. LMP 02/09/2014. Lidocaine jelly used now only prn.  Currently no diarrhea. Used Triamcinolone cream prn.  She has read on line that the areas in the vulva could also be linked to Chron's of the vulva.  She would like to make sure everything is OK.  She has never had vulvar biopsy and no prior diagnosis of LSA.      Review of Systems  Constitutional: Negative for fever, chills and fatigue.  Cardiovascular: Negative.   Gastrointestinal: Negative.  Negative for nausea, vomiting, diarrhea and constipation.  Genitourinary: Positive for vaginal pain. Negative for dysuria, urgency, frequency, hematuria, flank pain, vaginal bleeding, vaginal discharge, genital sores and pelvic pain.  Musculoskeletal: Negative.   Skin: Negative.   Neurological: Negative.   Psychiatric/Behavioral: Negative.        Objective:   Physical Exam  Constitutional: She is oriented to person, place, and time. She appears well-developed and well-nourished. No distress.  Pulmonary/Chest: Effort normal.  Abdominal: Soft. She exhibits no distension and no mass. There is no tenderness. There is no rebound.  Genitourinary:     There still remains linear cuts but they are healing.  There is an area of raised half moon shaped lesion that could be irritation vs. other lesion such as LSA.  The labia minora and majora are very  atrophic and lesion is located upper left labia. The clitoris is small and under the hood without irritation.  Musculoskeletal: Normal range of motion.  Neurological: She is alert and oriented to person, place, and time.  Psychiatric: She has a normal mood and affect. Her behavior is normal. Judgment and thought content normal.         Assessment:     History of Crohn's Recent yeast vaginitis almost resolved Area of ? LSA or lesion related to Chron's on left vulva.    Plan:     Will get vulvar biopsy and follow She has Xylocaine jelly at home and will use this on the day of biopsy sine she is very fearful of pain during the procedure.

## 2015-01-01 NOTE — Progress Notes (Signed)
Encounter reviewed by Dr. Patsye Sullivant Silva.  

## 2015-01-02 ENCOUNTER — Telehealth: Payer: Self-pay | Admitting: Obstetrics and Gynecology

## 2015-01-02 NOTE — Telephone Encounter (Signed)
Spoke with patient. Patient would like to know what to expect from vulvar biopsy. Advised patient MD will clean the vulva with antiseptic wash, then numb the area of skin with a small amount of lidocaine using a small needle. The area of concern will be removed with a small punch biopsy. Any bleeding is stopped with a topical agent to stop the bleeding. Advised she will experience a stick and burn sensation with the topical numbing but this resolves quickly. Total procedure time is around 15 minutes. Patient would like to know if she will need a driver. Advised patient will likely be able to drive herself but if she has someone to come with her to drive that would be great. Patient declines stating "This is something I prefer to do alone." Advised that is okay. Patient is agreeable.  Routing to provider for final review. Patient agreeable to disposition. Will close encounter.   Patient aware provider will review message and nurse will return call if any additional advice or change of disposition.

## 2015-01-02 NOTE — Telephone Encounter (Signed)
Call to patient. Advised of benefit quote received for vulvar biopsy.  Patient agreeable. Scheduled procedure. Passed call to triage w/questions regarding prep and the procedure specifically.

## 2015-01-06 ENCOUNTER — Encounter: Payer: Self-pay | Admitting: Obstetrics and Gynecology

## 2015-01-06 ENCOUNTER — Ambulatory Visit (INDEPENDENT_AMBULATORY_CARE_PROVIDER_SITE_OTHER): Payer: 59 | Admitting: Obstetrics and Gynecology

## 2015-01-06 DIAGNOSIS — N9089 Other specified noninflammatory disorders of vulva and perineum: Secondary | ICD-10-CM | POA: Diagnosis not present

## 2015-01-06 NOTE — Progress Notes (Signed)
Subjective:     Patient ID: Chelsey James, female   DOB: 09-05-1965, 49 y.o.   MRN: [id number]  HPI  49 year old GO here for vulvar biopsy.  Has vulvar ulceration and lesions.  Had ulceration of the skin. Prescribed Diflucan, steroid cream, and Xylocaine. Feels much better.  Clitoral changed.  Labia fused.  Concerned about this. Significant trepidation about the biopsy and needle use today to provide the local anesthetic.   History of Crohn's and colectomy.   LMP 02/09/14.  Contraception - Essure. Last pap 08/25/14 - negative.   Review of Systems    Hot flashes resolved. Objective:   Physical Exam  Genitourinary:      Vulvar biopsy.  Consent for procedure.  Sterile prep of skin with betadine.  1% local lidocaine - lot number [id number], exp 08/13/15. 3 mm punch biopsy used to biopsy right perianal region.  Tissue to pathology.  AgNO3 used for hemostasis.  Minimal EBL.  No complications.     Assessment:     Vulvar lesions.  Probable lichen sclerosus.  History of Crohn's.    Plan:     Discussion of lichen sclerosis and probable treatment with Clobetasol 0.05% ointment to area bid for one month.  Apply in a thin layer.  Areas of treatment identified.  Anticipate follow up in 4 - 5 weeks.  Post biopsy instructions and precautions given.

## 2015-01-06 NOTE — Patient Instructions (Signed)

## 2015-01-11 LAB — IPS OTHER TISSUE BIOPSY

## 2015-01-16 ENCOUNTER — Telehealth: Payer: Self-pay

## 2015-01-16 MED ORDER — CLOBETASOL PROPIONATE 0.05 % EX OINT: TOPICAL_OINTMENT | CUTANEOUS | Status: DC

## 2015-01-16 NOTE — Telephone Encounter (Signed)
-----   Message from Nunzio Cobbs, MD sent at 01/12/2015  6:46 PM EDT ----- Please let the patient know that the biopsy showed eczematous dermatitis and not the lichen sclerosus.  I am going to recommend the same treatment with the Clobetasol 0.05% ointment to areas affected once a day for one month.  Dispense one tube, one refill.  Please sent to pharmacy of choice.  Please schedule recheck for one month.   Cc- Patty Brien Few

## 2015-01-16 NOTE — Telephone Encounter (Signed)
Spoke with patient. Advised of results as seen below from [place]. Patient is agreeable and verbalizes understanding. Clobetasol 0.05% ointment 1RF sent to pharmacy on file. 1 month recheck scheduled for 7/6 at 2:45pm with Dr.Silva. Patient is agreeable to date and time.  Routing to provider for final review. Patient agreeable to disposition. Will close encounter.

## 2015-02-15 ENCOUNTER — Encounter: Payer: Self-pay | Admitting: Obstetrics and Gynecology

## 2015-02-15 ENCOUNTER — Ambulatory Visit (INDEPENDENT_AMBULATORY_CARE_PROVIDER_SITE_OTHER): Payer: 59 | Admitting: Obstetrics and Gynecology

## 2015-02-15 VITALS — BP 104/68 | HR 70 | Ht 61.25 in | Wt 168.6 lb

## 2015-02-15 DIAGNOSIS — N7689 Other specified inflammation of vagina and vulva: Secondary | ICD-10-CM | POA: Diagnosis not present

## 2015-02-15 DIAGNOSIS — L309 Dermatitis, unspecified: Secondary | ICD-10-CM

## 2015-02-15 NOTE — Progress Notes (Signed)
Patient ID: Chelsey James, female   DOB: 24-Apr-1966, 49 y.o.   MRN: [id number] GYNECOLOGY  VISIT   HPI: 49 y.o.   Single  Caucasian  female   G0P0 with Patient's last menstrual period was 02/09/2014 (approximate).   here for 1 month follow up to vulvar biopsy.    Biopsy showed spongiotic dermatitis in Jan 06, 2015.  Patient using Clobetasol ointment once weekly. No longer having any pain or fissures.  No longer has ulcerations/cuts.  Very concerned about the fact that her clitoris changes and became covered by the clitoral hood. Feels it has returned to a more normal appearance but is not still quite the same.   Not sexually active.  A little afraid to become active.   Questions about whether of not the ulcerative changes are due to her Crohn's disease. Asking about the etiology of the eczematous change of the vulva.  GYNECOLOGIC HISTORY: Patient's last menstrual period was 02/09/2014 (approximate). Contraception: Essure Menopausal hormone therapy: n/a Last mammogram: 05-18-14 Density Cat.A/Neg:[facility] Last pap smear: 08-15-14 wnl:neg HR HPV        OB History    Gravida Para Term Preterm AB TAB SAB Ectopic Multiple Living   0 0                 Patient Active Problem List   Diagnosis Date Noted  . 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 Crohn's   . Anemia   . Inflammatory arthritis 06/2009  . STD (sexually transmitted disease) 8/04    pos HSV 2 serology  . Diabetes mellitus without complication 26/71    Past Surgical History  Procedure Laterality Date  . Breast reduction surgery Bilateral 1991  . Tonsillectomy  child  . Colon resection  10/1998  . Colposcopy  ~1990    cx dysplasia  . Colectomy  11/08    partial   . Shoulder arthroscopy w/ acromial repair Right 09/2011    and removal of bone spur  . Partial colectomy  11/08    J Pouch inserted  . Appendectomy  4/04    laparoscopic  . Essure tubal ligation  Bilateral 10/2009    in office    Current Outpatient Prescriptions  Medication Sig Dispense Refill  . clobetasol ointment (TEMOVATE) 0.05 % Apply to the area one time per day. 30 g 1  . Golimumab (SIMPONI Dra[place]) Inject into the skin.    Marland Kitchen loperamide (IMODIUM) 2 MG capsule Take 2 mg by mouth 4 (four) times daily as needed. For constipation      No current facility-administered medications for this visit.     ALLERGIES: Ciprofloxacin and Valium  Family History  Problem Relation Age of Onset  . Adopted: Yes  . Diabetes Maternal Grandmother   . Diabetes Maternal Grandfather   . Diabetes Paternal Grandmother   . Diabetes Paternal Grandfather     History   Social History  . Marital Status: Single    Spouse Name: N/A  . Number of Children: N/A  . Years of Education: N/A   Occupational History  . Not on file.   Social History Main Topics  . Smoking status: Never Smoker   . Smokeless tobacco: Never Used  . Alcohol Use: No  . Drug Use: No  . Sexual Activity:    Partners: Male     Comment: Essure   Other Topics Concern  . Not on file   Social History Narrative  ROS:  Pertinent items are noted in HPI.  PHYSICAL EXAMINATION:    BP 104/68 mmHg  Pulse 70  Ht 5' 1.25" (1.556 m)  Wt 168 lb 9.6 oz (76.476 kg)  BMI 31.59 kg/m2  LMP 02/09/2014 (Approximate)    General appearance: alert, cooperative and appears stated age  Pelvic: External genitalia:  no lesions.  More normal pinkish color to the vulvar epithelium.  Some veins present.  No ulcers or excoriation. Clitoris visible.              Urethra:  normal appearing urethra with no masses, tenderness or lesions               Chaperone was present for exam.  ASSESSMENT  Dermatitis of the vulva. Spongiotic dermatitis.  Hx of Crohn's disease.   PLAN  Counseled regarding vulvar biopsy results.  Biopsy did not show lichen sclerosus, but the clinical picture and presentation were very supportive of this.  Improved  with clobetasol ointment.  Will reduce the use of the medication to once a day 2 - 3 times a week as maintenance dosing.  Can increase to twice a day for flares of dermatitis but not do this for more than one month at a time.  Discussed that the Clobetasol is a high potency steroid that can cause adrenal suppression if used chronically.  OK for sexual activity.  Lubricants for sexual activity.  Vaginal estrogen if atrophy symptoms not amenable to lubricants. Follow up for routine annual visits.  An After Visit Summary was printed and given to the patient.  __15__ minutes face to face time of which over 50% was spent in counseling.

## 2015-04-14 ENCOUNTER — Telehealth: Payer: Self-pay | Admitting: Nurse Practitioner

## 2015-04-14 NOTE — Telephone Encounter (Signed)
Spoke with patient. Patient states that she has always had trouble falling asleep but has been having an increasingly hard time lately with increased stress at work. "I just can not turn my brain off. I am so tired and I can not sleep at night." Patient has tried Benadryl to sleep and OTC sleep medication with no relief. States she has a friend who takes Xanax to help her sleep. Patient took 1/2 tablet of Xanax last night to see if this would help and she was able to sleep without any problems and woke up not feeling drowsy. "I do not know if that is appropriate to ask for for this, but it helped." States she has tried Ambien and Lunesta before and felt drowsy upon waking. Patient is requesting rx to help her sleep. Advised will need to speak with Chelsey Cage, FNP and return call with further recommendations. Patient is agreeable.

## 2015-04-14 NOTE — Telephone Encounter (Signed)
This request needs to come from her PCP.

## 2015-04-14 NOTE — Telephone Encounter (Signed)
Patient says she is having a hard time sleeping and would like to speak with nurse about getting something for this.

## 2015-04-18 NOTE — Telephone Encounter (Signed)
Spoke with patient. Advised of message as seen below from Chelsey James, [place]. Patient states that she does not have a PCP and is seen at an Urgent Care when needed. Asking if there is something else she could try that we could prescribe or if she needs to see a Urgent Care. Advised I will speak with provider and return call with further recommendations. Patient is agreeable.  Routing to [place] for review.

## 2015-04-18 NOTE — Telephone Encounter (Signed)
Left message to call Raymont Andreoni at [phone]. 

## 2015-04-19 MED ORDER — TEMAZEPAM 15 MG PO CAPS
15.0000 mg | ORAL_CAPSULE | Freq: Every evening | ORAL | Status: DC | PRN
Start: 2015-04-19 — End: 2015-07-11

## 2015-04-19 NOTE — Telephone Encounter (Signed)
Spoke with patient. Advised of message as seen below from [place]. Patient is agreeable. Rx for Restoril 15 mg qhs prn #30 0RF printed and to [place] for review and signature for fax.

## 2015-04-19 NOTE — Telephone Encounter (Signed)
Ok to use Restoril 15mg  qhs prn.  Should not use nightly, only after a night or two of poor sleep.  Will need to do Rx tomorrow as must be signed and faxed.  Will leave in my inbox and take care of in the am.

## 2015-04-20 NOTE — Telephone Encounter (Signed)
Spoke with patient. Advised signed rx for Restoril 15 mg qhs prn #30 0RF faxed with cover sheet and confirmation to [facility] on file. Patient is agreeable.  Routing to provider for final review. Patient agreeable to disposition. Will close encounter.

## 2015-04-25 ENCOUNTER — Other Ambulatory Visit: Payer: Self-pay

## 2015-04-25 DIAGNOSIS — Z1231 Encounter for screening mammogram for malignant neoplasm of breast: Secondary | ICD-10-CM

## 2015-06-20 ENCOUNTER — Ambulatory Visit
Admission: RE | Admit: 2015-06-20 | Discharge: 2015-06-20 | Disposition: A | Discharge status: Home / Self Care | Payer: 59 | Source: Ambulatory Visit

## 2015-06-20 DIAGNOSIS — Z1231 Encounter for screening mammogram for malignant neoplasm of breast: Secondary | ICD-10-CM

## 2015-07-11 ENCOUNTER — Other Ambulatory Visit: Payer: Self-pay | Admitting: Obstetrics & Gynecology

## 2015-07-11 NOTE — Telephone Encounter (Signed)
Medication refill request: Temazepam 15 mg Last AEX: 08/25/2014 PG Next AEX: Not Scheduled Last MMG (if hormonal medication request):06/20/2015 BIRADS 1 Refill authorized: 04/28/2015 Temazepam 15 mg #30 Capsules 0 Refills  Today: #30 Capsules 0 Refills

## 2015-07-11 NOTE — Telephone Encounter (Addendum)
Patient is needing a refill of her temazepam (has been working well for her) she has switched jobs and her insurance runs out starting in December. Patient's new insurance does not kick in until January patient is needing Dr. Sabra Heck to send her in a refill so she doesn't have to pay a huge premium before then. Best # to reach [phone] Preferred Pharmacy: [facility]

## 2015-07-13 MED ORDER — TEMAZEPAM 15 MG PO CAPS: 15.0000 mg | ORAL_CAPSULE | Freq: Every evening | ORAL | Status: DC | PRN

## 2015-10-11 IMAGING — CT CT ABD-PELV W/ CM
1 of 3 series · 13 of 32 positions shown, 18 images · IV contrast (APPLIED)
Comparison: CT Abdomen and Pelvis 05/27/2011.

CLINICAL DATA: 48-year-old female with acute fever. Initial
encounter. Personal history of Crohn disease status post colectomy
and J-pouch creation in 3667.

EXAM:
CT ABDOMEN AND PELVIS WITH CONTRAST
TECHNIQUE: Multidetector CT imaging of the abdomen and pelvis was performed
using the standard protocol following bolus administration of
intravenous contrast.
CONTRAST:  100mL OMNIPAQUE IOHEXOL 300 MG/ML  SOLN

[Series 2: abd/pelvis 5.0 b31f · axial · 0.75mm/px · z∈[+862,+1262]mm · 13 of 90 slices shown, 18 images]
[im 5/90  soft-tissue]
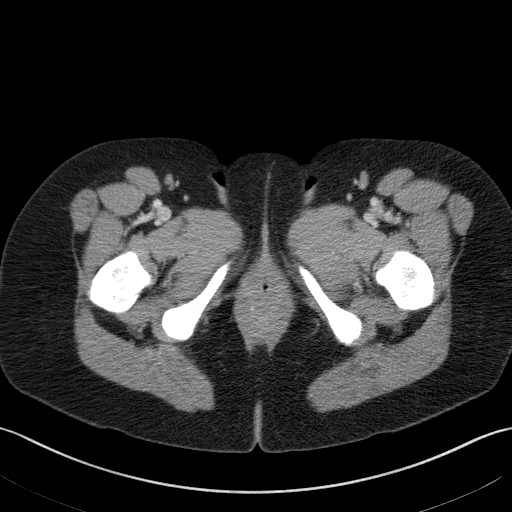
[im 5/90  bone]
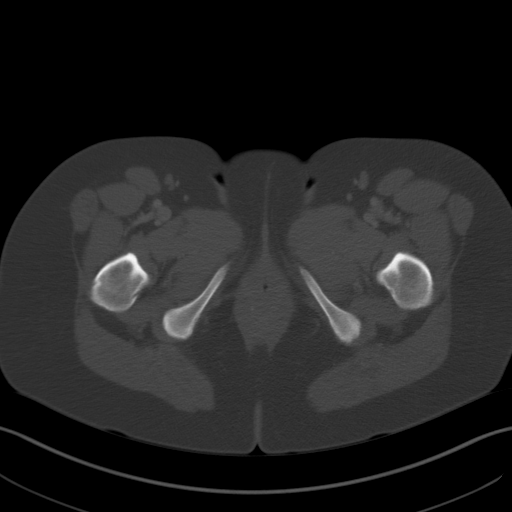
[im 15/90  soft-tissue]
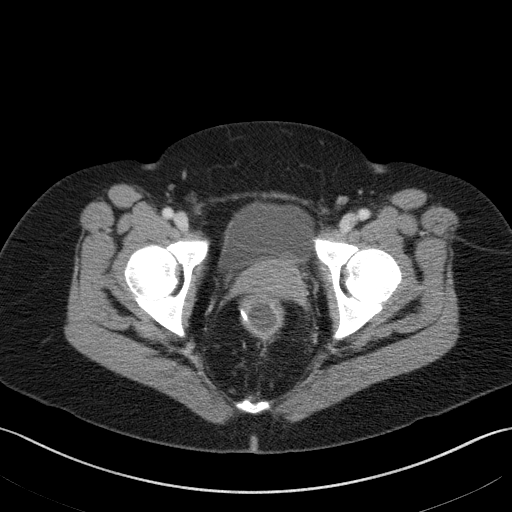
[im 19/90  soft-tissue]
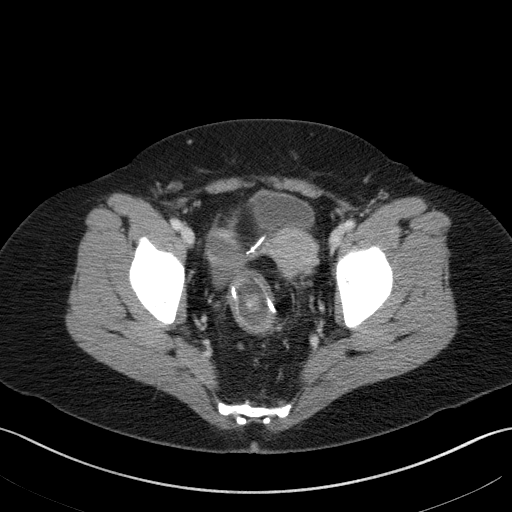
[im 29/90  soft-tissue]
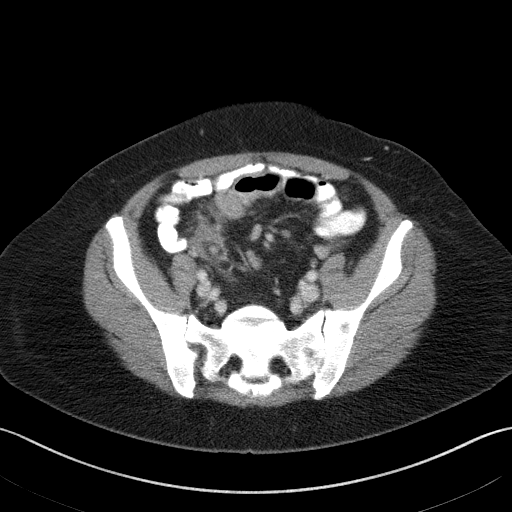
[im 33/90  soft-tissue]
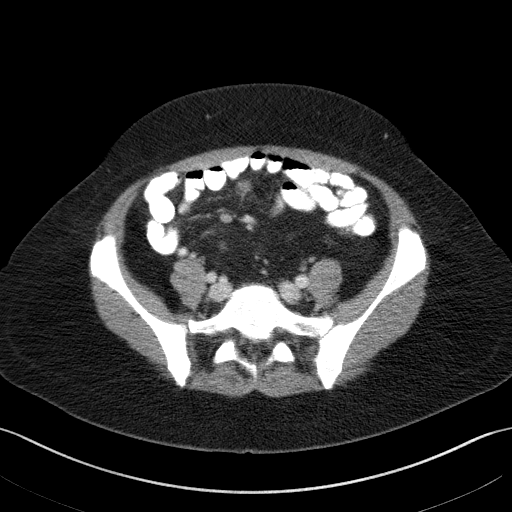
[im 43/90  soft-tissue]
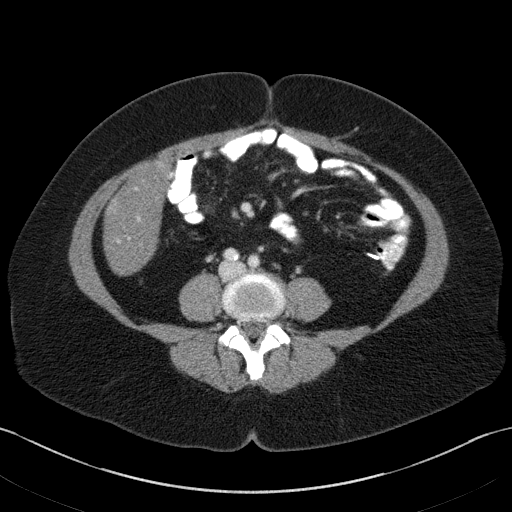
[im 47/90  soft-tissue]
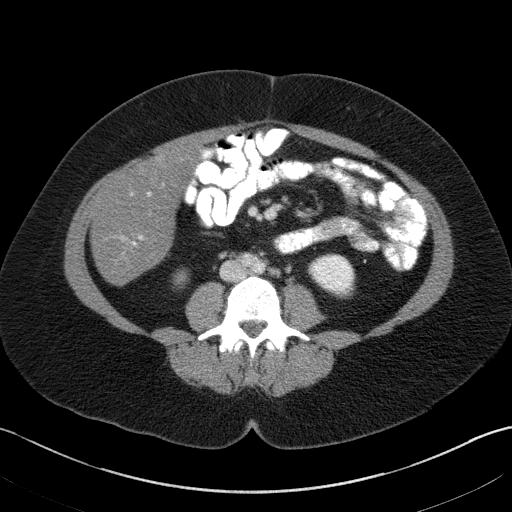
[im 57/90  soft-tissue]
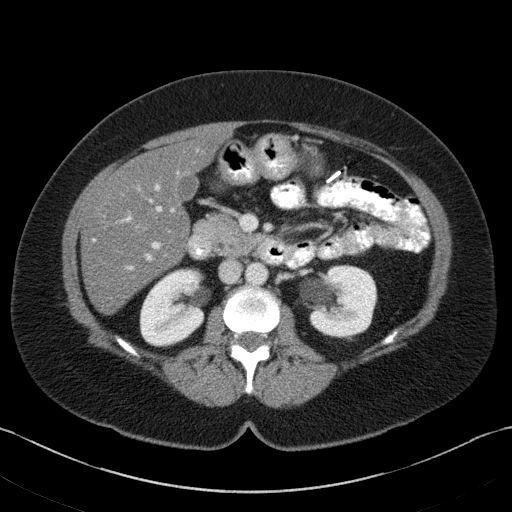
[im 61/90  soft-tissue]
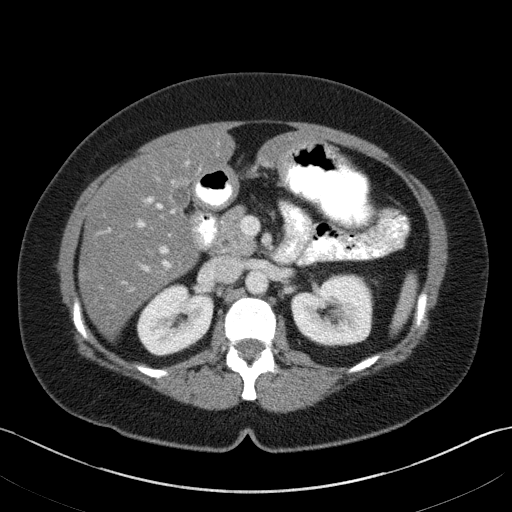
[im 61/90  bone]
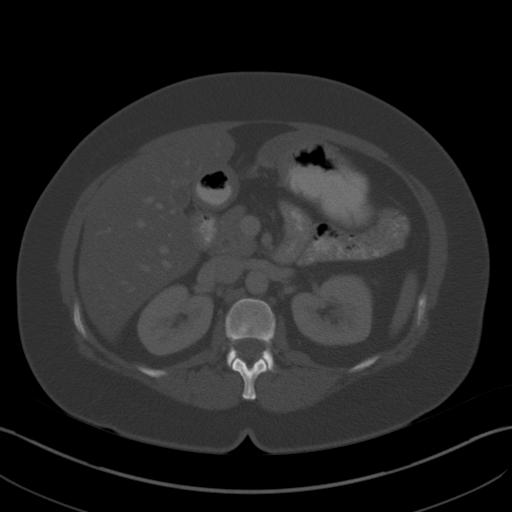
[im 71/90  soft-tissue]
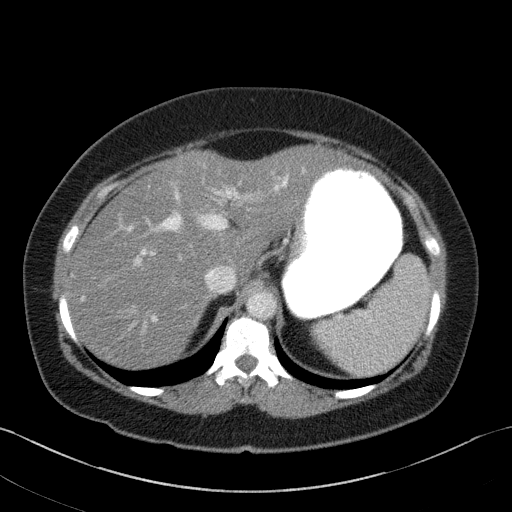
[im 71/90  lung]
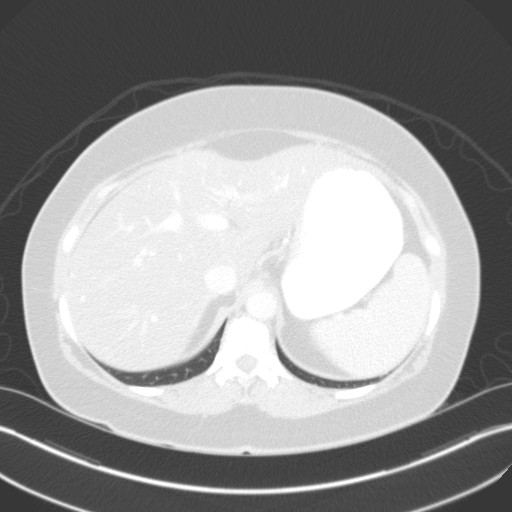
[im 75/90  soft-tissue]
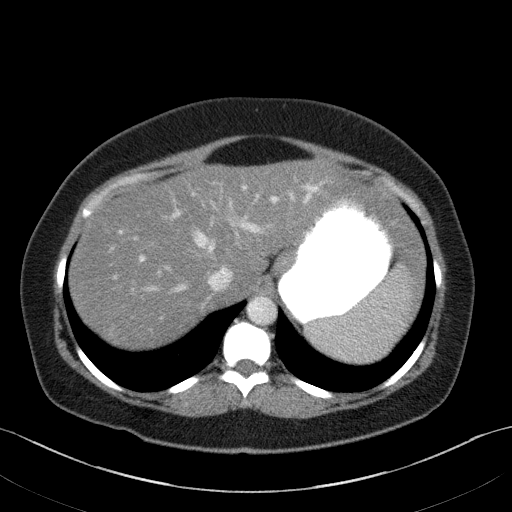
[im 75/90  lung]
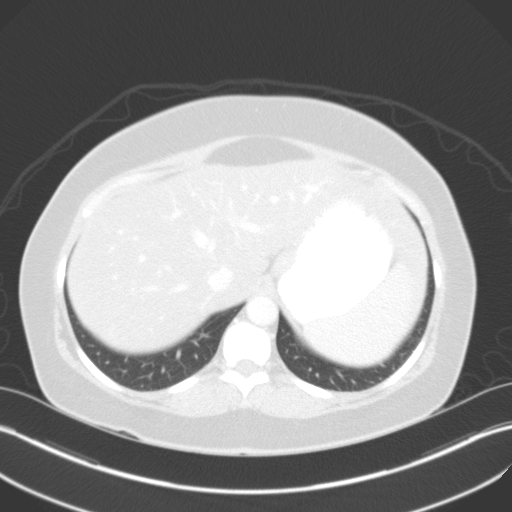
[im 80/90  lung]
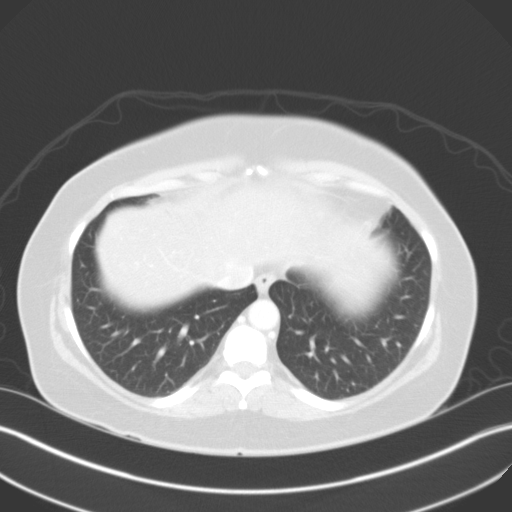
[im 85/90  soft-tissue]
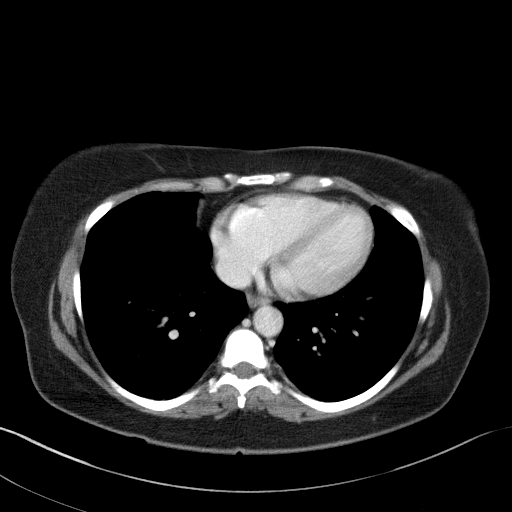
[im 85/90  lung]
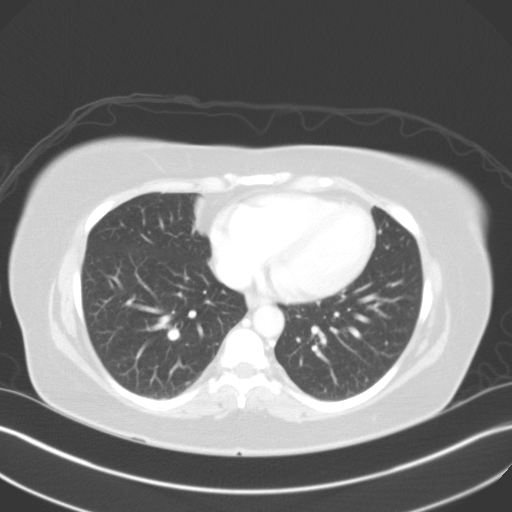

[13 of 32 positions shown; findings below may reference images not displayed]

FINDINGS: Negative lung bases.  No pericardial or pleural effusion.

No acute osseous abnormality identified.

No pelvic free fluid. Distal bowel anastomosis re- identified. Mild
bowel wall thickening throughout the region of the anastomosis,
extending toward the anal verge. Mild surrounding mesenteric
stranding. Wall thickening up to 12 mm, which along the right aspect
of the anastomosis is inseparable from the right adnexa. Increased
perirectal lymph nodes (e.g. Series 2, image 79) and distal small
bowel mesentery lymph nodes (image 65).

Uterus and left adnexal stable and within normal limits.
Unremarkable bladder.

Upstream of the anastomosis mild bowel wall thickening gradually
abating to normal appearing small bowel loops. No dilated loops.
Oral contrast has nearly reached the anastomosis. Stomach is
distended but otherwise within normal limits. Negative duodenum.

Moderate hepatic steatosis is not significantly changed. Negative
gallbladder, spleen, pancreas, adrenal glands, portal venous system.
Kidneys are stable and normal aside from small cysts. No abdominal
free fluid. Major arterial structures in the abdomen and pelvis are
patent. Aortoiliac calcified atherosclerosis noted.
IMPRESSION: 1. New circumferential bowel wall thickening diffusely affecting the
J pouch and extending distally toward the anal verge. Inflamed J
pouch inseparable from the right adnexa.
2. Associated reactive appearing mesenteric and perirectal
lymphadenopathy.
3. Constellation is most compatible with acute/recurrent
inflammatory bowel disease. No associated bowel obstruction.
4. Stable moderate hepatic steatosis.

## 2015-10-11 IMAGING — CR DG CHEST 2V
2 series · 2 of 2 positions shown · non-contrast
Comparison: None.

CLINICAL DATA: Fever.

EXAM:
CHEST  2 VIEW

[w chest pa]
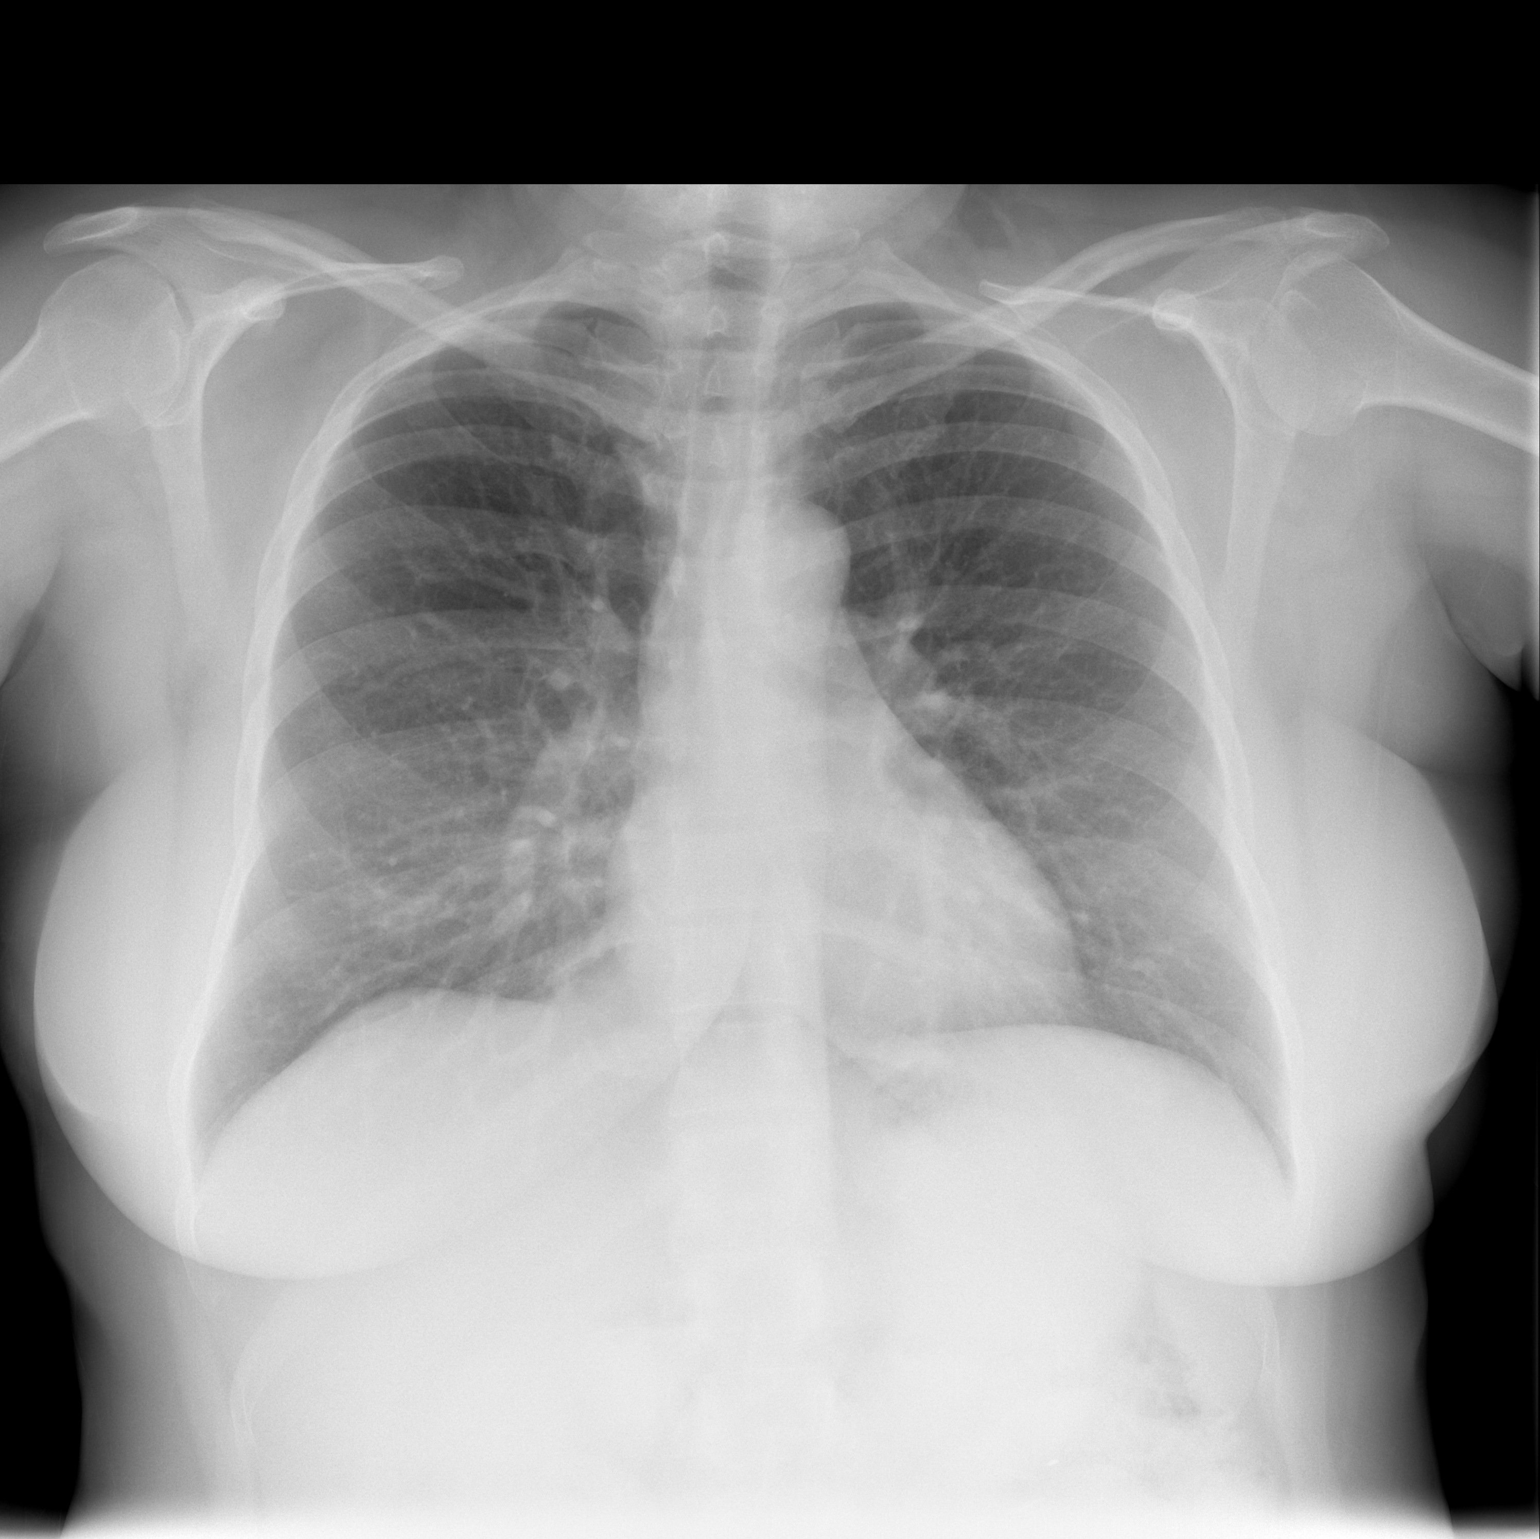

[w chest lat]
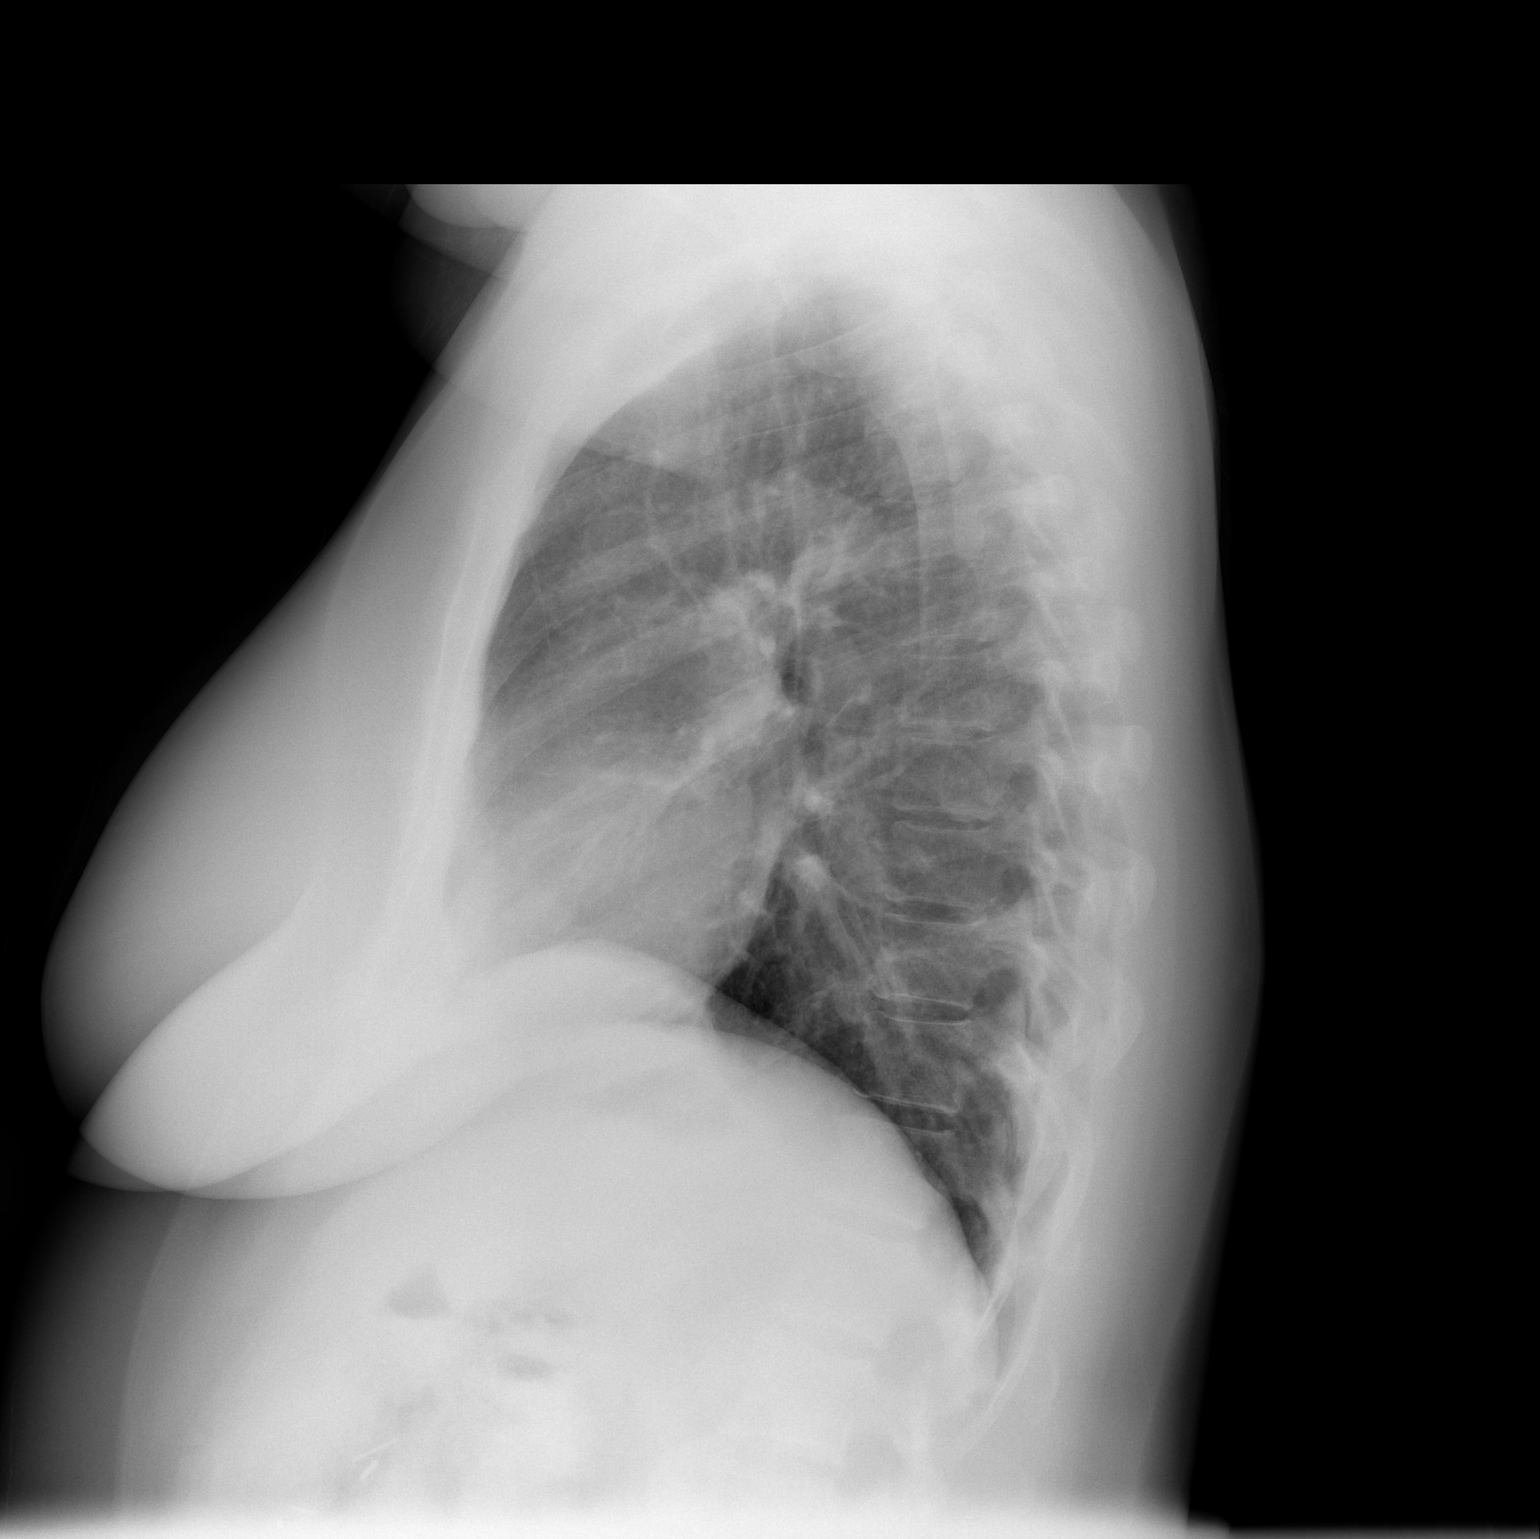

[2 of 2 positions shown; findings below may reference images not displayed]

FINDINGS: The heart size and mediastinal contours are within normal limits.
Both lungs are clear. The visualized skeletal structures are
unremarkable.
IMPRESSION: No active cardiopulmonary disease.

## 2015-12-20 ENCOUNTER — Encounter: Payer: Self-pay | Admitting: Nurse Practitioner

## 2015-12-20 ENCOUNTER — Ambulatory Visit (INDEPENDENT_AMBULATORY_CARE_PROVIDER_SITE_OTHER): Payer: 59 | Admitting: Nurse Practitioner

## 2015-12-20 VITALS — BP 108/66 | HR 72 | Ht 61.0 in | Wt 164.0 lb

## 2015-12-20 DIAGNOSIS — Z23 Encounter for immunization: Secondary | ICD-10-CM | POA: Diagnosis not present

## 2015-12-20 DIAGNOSIS — Z01419 Encounter for gynecological examination (general) (routine) without abnormal findings: Secondary | ICD-10-CM | POA: Diagnosis not present

## 2015-12-20 DIAGNOSIS — N7689 Other specified inflammation of vagina and vulva: Secondary | ICD-10-CM | POA: Diagnosis not present

## 2015-12-20 DIAGNOSIS — R81 Glycosuria: Secondary | ICD-10-CM | POA: Diagnosis not present

## 2015-12-20 DIAGNOSIS — L309 Dermatitis, unspecified: Secondary | ICD-10-CM

## 2015-12-20 DIAGNOSIS — N951 Menopausal and female climacteric states: Secondary | ICD-10-CM | POA: Diagnosis not present

## 2015-12-20 DIAGNOSIS — Z0000 Encounter for general adult medical examination without abnormal findings: Secondary | ICD-10-CM | POA: Diagnosis not present

## 2015-12-20 LAB — HEMOGLOBIN A1C
Hgb A1c MFr Bld: 11.9 % — ABNORMAL HIGH (ref <5.7)
Mean Plasma Glucose: 295 mg/dL

## 2015-12-20 NOTE — Patient Instructions (Signed)

## 2015-12-20 NOTE — Progress Notes (Signed)
Patient ID: Chelsey James, female   DOB: September 11, 1965, 50 y.o.   MRN: [id number] 50 y.o. G0P0000 Single  Caucasian Fe here for annual exam.  Some vaso symptoms that are rare but mild and very tolerable.  Flare of vulvar dermatitis flare a few months ago and responded well to Temovate and took combination of Triamcinolone and Nystatin.     Not dating. 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  Off Simponi since November, 2016 for inflammatory arthritis and has done well so far.  Patient's last menstrual period was 03/12/2014 (approximate).          Sexually active: No.  The current method of family planning is post menopausal status.    Exercising: Yes.    zumba occasionally Smoker:  no  Health Maintenance: Pap: 08/15/14, Negative with neg HR HPV MMG: 06/20/15, Bi-Rads 1: Negative Colonoscopy: Not indicated still going to [place]: unsure Labs: Rheumatology   Urine: Glucose 500, others negative(diet with eating cupcakes this am)   reports that she has never smoked. She has never used smokeless tobacco. She reports that she does not drink alcohol or use illicit drugs.  Past Medical History  Diagnosis Date  . Crohn's   . Anemia   . Inflammatory arthritis ([place]) 06/2009  . STD (sexually transmitted disease) 8/04    pos HSV 2 serology  . Diabetes mellitus without complication ([place]) [id number]    Past Surgical History  Procedure Laterality Date  . Breast reduction surgery Bilateral 1991  . Tonsillectomy  child  . Colon resection  10/1998  . Colposcopy  ~1990    cx dysplasia  . Colectomy  11/08    partial   . Shoulder arthroscopy w/ acromial repair Right 09/2011    and removal of bone spur  . Partial colectomy  11/08    J Pouch inserted  . Appendectomy  4/04    laparoscopic  . Essure tubal ligation Bilateral 10/2009    in office    Current Outpatient Prescriptions  Medication Sig Dispense Refill  . clobetasol ointment (TEMOVATE) 0.05 % Apply to the area one time per  day. 30 g 1  . loperamide (IMODIUM) 2 MG capsule Take 2 mg by mouth 4 (four) times daily as needed. For constipation     . temazepam (RESTORIL) 15 MG capsule Take 1 capsule (15 mg total) by mouth at bedtime as needed for sleep. 30 capsule 0   No current facility-administered medications for this visit.    Family History  Problem Relation Age of Onset  . Adopted: Yes  . Diabetes Maternal Grandmother   . Diabetes Maternal Grandfather   . Diabetes Paternal Grandmother   . Diabetes Paternal Grandfather     ROS:  Pertinent items are noted in HPI.  Otherwise, a comprehensive ROS was negative.  Exam:   BP 108/66 mmHg  Pulse 72  Ht 5\' 1"  (1.549 m)  Wt 164 lb (74.39 kg)  BMI 31.00 kg/m2  LMP 03/12/2014 (Approximate) Height: 5\' 1"  (154.9 cm) Ht Readings from Last 3 Encounters:  12/20/15 5\' 1"  (1.549 m)  02/15/15 5' 1.25" (1.556 m)  01/06/15 5' 1.75" (1.568 m)    General appearance: alert, cooperative and appears stated age Head: Normocephalic, without obvious abnormality, atraumatic Neck: no adenopathy, supple, symmetrical, trachea midline and thyroid normal to inspection and palpation Lungs: clear to auscultation bilaterally Breasts: normal appearance, no masses or tenderness Heart: regular rate and rhythm Abdomen: soft, non-tender; no masses,  no organomegaly Extremities:  extremities normal, atraumatic, no cyanosis or edema Skin: Skin color, texture, turgor normal. No rashes or lesions Lymph nodes: Cervical, supraclavicular, and axillary nodes normal. No abnormal inguinal nodes palpated Neurologic: Grossly normal   Pelvic: External genitalia:  no lesions, no dermatitis, she is getting further vulvar atrophy              Urethra:  normal appearing urethra with no masses, tenderness or lesions              Bartholin's and Skene's: normal                 Vagina: atrophic appearing vagina with normal color and discharge, no lesions              Cervix: anteverted               Pap taken: No. Bimanual Exam:  Uterus:  normal size, contour, position, consistency, mobility, non-tender              Adnexa: no mass, fullness, tenderness               Rectovaginal: deferred               Anus: deferred secondary to colectomy surgery in the past - this causes increase pain   Chaperone present: no  A:  Well Woman with normal exam  Menopausal History of HSV I/ II DM, HTN - glycosuria today Vit D deficiency S/P office Essure S/P colectomy 11/08 History of Inflammatory Arthritis off Simponi currently  Update immunization    P:   Reviewed health and wellness pertinent to exam  Pap smear as above  Mammogram is due 11/17  Will recheck [place] and HGB AIC  She does not need a refill on Temovate or Nystatin at this time  TDaP is given today  Counseled on breast self exam, mammography screening, adequate intake of calcium and vitamin D, diet and exercise return annually or prn  An After Visit Summary was printed and given to the patient.

## 2015-12-21 LAB — FOLLICLE STIMULATING HORMONE: FSH: 101.4 m[IU]/mL

## 2015-12-26 ENCOUNTER — Telehealth: Payer: Self-pay | Admitting: Emergency Medicine

## 2015-12-26 DIAGNOSIS — R81 Glycosuria: Secondary | ICD-10-CM

## 2015-12-26 DIAGNOSIS — R7309 Other abnormal glucose: Secondary | ICD-10-CM

## 2015-12-26 NOTE — Telephone Encounter (Signed)
-----   Message from Kem Boroughs, [place] sent at 12/21/2015  9:23 AM EDT ----- Results via my chart:   Please call her and help with apt to PCP if needed.  Faythe Dingwall,  [facility] - hormone test is elevated at the menopausal range.  The HGB AIC is really elevated at 11.9.  You must see PCP right away to get this under control.  Colletta [place] will be calling you to help set up the apt if needed.

## 2015-12-26 NOTE — Telephone Encounter (Signed)
Patient returned call and she is given message from Kem Boroughs, [place]. Verbalizes understanding of results and aware of importance of close follow up with diabetes management. Does not currently have a PCP. Requests PCP near her home, [place] in [place].  Referral entered.  cc Kerry Hough for referral and patient contact.

## 2015-12-26 NOTE — Telephone Encounter (Signed)
Message left to return call to [facility] at [phone] or triage nurse at [phone]  No PCP listed on file. May need PCP referral.

## 2016-01-10 NOTE — Progress Notes (Signed)
Encounter reviewed by Dr. Norie Latendresse Amundson C. Silva.  

## 2016-01-15 DIAGNOSIS — E1169 Type 2 diabetes mellitus with other specified complication: Secondary | ICD-10-CM | POA: Insufficient documentation

## 2016-05-24 ENCOUNTER — Other Ambulatory Visit: Payer: Self-pay | Admitting: Nurse Practitioner

## 2016-05-24 DIAGNOSIS — Z1231 Encounter for screening mammogram for malignant neoplasm of breast: Secondary | ICD-10-CM

## 2016-06-20 ENCOUNTER — Ambulatory Visit
Admission: RE | Admit: 2016-06-20 | Discharge: 2016-06-20 | Disposition: A | Discharge status: Home / Self Care | Payer: 59 | Source: Ambulatory Visit | Attending: Nurse Practitioner | Admitting: Nurse Practitioner

## 2016-06-20 DIAGNOSIS — Z1231 Encounter for screening mammogram for malignant neoplasm of breast: Secondary | ICD-10-CM

## 2016-10-08 DIAGNOSIS — E782 Mixed hyperlipidemia: Secondary | ICD-10-CM | POA: Insufficient documentation

## 2016-10-30 DIAGNOSIS — M542 Cervicalgia: Secondary | ICD-10-CM | POA: Insufficient documentation

## 2016-12-24 ENCOUNTER — Encounter: Payer: Self-pay | Admitting: Nurse Practitioner

## 2016-12-24 ENCOUNTER — Ambulatory Visit (INDEPENDENT_AMBULATORY_CARE_PROVIDER_SITE_OTHER): Payer: 59 | Admitting: Nurse Practitioner

## 2016-12-24 VITALS — BP 116/70 | HR 72 | Ht 61.5 in | Wt 164.0 lb

## 2016-12-24 DIAGNOSIS — Z0000 Encounter for general adult medical examination without abnormal findings: Secondary | ICD-10-CM

## 2016-12-24 DIAGNOSIS — N951 Menopausal and female climacteric states: Secondary | ICD-10-CM

## 2016-12-24 DIAGNOSIS — Z01419 Encounter for gynecological examination (general) (routine) without abnormal findings: Secondary | ICD-10-CM

## 2016-12-24 DIAGNOSIS — E559 Vitamin D deficiency, unspecified: Secondary | ICD-10-CM | POA: Diagnosis not present

## 2016-12-24 MED ORDER — TEMAZEPAM 15 MG PO CAPS: 15.0000 mg | ORAL_CAPSULE | Freq: Every evening | ORAL | 0 refills | Status: DC | PRN

## 2016-12-24 MED ORDER — FLUCONAZOLE 150 MG PO TABS: 150.0000 mg | ORAL_TABLET | Freq: Once | ORAL | 2 refills | Status: AC

## 2016-12-24 MED ORDER — CLOBETASOL PROPIONATE 0.05 % EX OINT: TOPICAL_OINTMENT | CUTANEOUS | 2 refills | Status: DC

## 2016-12-24 MED ORDER — TRIAMCINOLONE ACETONIDE 0.025 % EX OINT: 1.0000 "application " | TOPICAL_OINTMENT | Freq: Two times a day (BID) | CUTANEOUS | 2 refills | Status: DC

## 2016-12-24 MED ORDER — NYSTATIN 100000 UNIT/GM EX CREA: 1.0000 "application " | TOPICAL_CREAM | Freq: Two times a day (BID) | CUTANEOUS | 2 refills | Status: DC

## 2016-12-24 NOTE — Progress Notes (Signed)
Patient ID: Chelsey James, female   DOB: [birth year], 51 y.o.   MRN: [id number]  51 y.o. G0P0000 Divorced Caucasian Fe here for annual exam.  Still off Simponi now for a year.  Last [facility] 12/20/15 was 101.4.  Some vaso symptoms but tolerable.  She is having more yeast infection with external irritation past few months.  Last HGB AIC was 8.2 better from previous 13.  01/10/15: Vulvar biopsy showed eczematous dermatitis and not the lichen sclerosus.  Still help with use clobetasol prn only.    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  Now traveling some, going to [place] twice a year.  Patient's last menstrual period was 03/12/2014 (approximate).          Sexually active: No.  The current method of family planning is tubal ligation, and post menopausal status.    Exercising: No.  The patient does not participate in regular exercise at present. Smoker:  no  Health Maintenance: Pap: 08/15/14, Negative with neg HR HPV  07/29/13, Negative with neg HR HPV  07/27/12, Negative with neg HR HPV History of Abnormal Pap: yes, in early 20's MMG: 06/20/16, 3D-no, Density Category B, Bi-Rads 1:  Negative Self Breast exams: sometimes  Colonoscopy: None, no longer has colon BMD: 2008 per patient, normal  TDaP: 12/20/15 HIV: 07/29/13 Labs: PCP takes care of all labs    reports that she has never smoked. She has never used smokeless tobacco. She reports that she drinks alcohol. She reports that she does not use drugs.  Past Medical History:  Diagnosis Date  . Anemia   . Crohn's   . Diabetes mellitus without complication ([place]) 67/61  . Inflammatory arthritis 06/2009  . STD (sexually transmitted disease) 8/04   pos HSV 2 serology  . Ulcerative colitis ([place])    [facility] History, Care Everywhere    Past Surgical History:  Procedure Laterality Date  . APPENDECTOMY  4/04   laparoscopic  . BREAST REDUCTION SURGERY Bilateral 1991  . COLON RESECTION  10/1998  . COLPOSCOPY  ~1990   cx dysplasia   . ESSURE TUBAL LIGATION Bilateral 10/2009   in office  . PARTIAL COLECTOMY  11/08   J Pouch inserted  . SHOULDER ARTHROSCOPY W/ ACROMIAL REPAIR Right 09/2011   and removal of bone spur  . TONSILLECTOMY  child    Current Outpatient Prescriptions  Medication Sig Dispense Refill  . Cinnamon 500 MG TABS Take 2 tablets by mouth daily.    . clobetasol ointment (TEMOVATE) 0.05 % Apply to the area one time per day. 30 g 2  . FARXIGA 10 MG TABS tablet Take 1 tablet by mouth daily.    Marland Kitchen glipiZIDE (GLUCOTROL) 10 MG tablet Take 1 tablet by mouth 2 (two) times daily.  0  . loperamide (IMODIUM) 2 MG capsule Take 2 mg by mouth 4 (four) times daily as needed. For constipation     . PSYLLIUM HUSK PO Take 5 capsules by mouth 3 (three) times daily.    . temazepam (RESTORIL) 15 MG capsule Take 1 capsule (15 mg total) by mouth at bedtime as needed for sleep. 30 capsule 0  . Turmeric 500 MG CAPS Take 2 capsules by mouth daily.    . fluconazole (DIFLUCAN) 150 MG tablet Take 1 tablet (150 mg total) by mouth once. Take one tablet.  Repeat in 48 hours if symptoms are not completely resolved. 2 tablet 2  . nystatin cream (MYCOSTATIN) Apply 1 application topically 2 (two) times daily.  Apply to affected area BID prn 30 g 2  . triamcinolone (KENALOG) 0.025 % ointment Apply 1 application topically 2 (two) times daily. 30 g 2   No current facility-administered medications for this visit.     Family History  Problem Relation Age of Onset  . Adopted: Yes  . Diabetes Maternal Grandmother   . Diabetes Maternal Grandfather   . Diabetes Paternal Grandmother   . Diabetes Paternal Grandfather     ROS:  Pertinent items are noted in HPI.  Otherwise, a comprehensive ROS was negative.  Exam:   BP 116/70 (BP Location: Right Arm, Patient Position: Sitting, Cuff Size: Large)   Pulse 72   Ht 5' 1.5" (1.562 m)   Wt 164 lb (74.4 kg)   LMP 03/12/2014 (Approximate)   BMI 30.49 kg/m  Height: 5' 1.5" (156.2 cm) Ht Readings  from Last 3 Encounters:  12/24/16 5' 1.5" (1.562 m)  12/20/15 5\' 1"  (1.549 m)  02/15/15 5' 1.25" (1.556 m)    General appearance: alert, cooperative and appears stated age Head: Normocephalic, without obvious abnormality, atraumatic Neck: no adenopathy, supple, symmetrical, trachea midline and thyroid normal to inspection and palpation Lungs: clear to auscultation bilaterally Breasts: normal appearance, no masses or tenderness Heart: regular rate and rhythm Abdomen: soft, non-tender; no masses,  no organomegaly Extremities: extremities normal, atraumatic, no cyanosis or edema Skin: Skin color, texture, turgor normal. No rashes or lesions Lymph nodes: Cervical, supraclavicular, and axillary nodes normal. No abnormal inguinal nodes palpated Neurologic: Grossly normal   Pelvic: External genitalia: multiple areas of redness, linear cuts at both vulva and perianal area.  Area that looked like LSA at the top of labia is not flared.              Urethra:  normal appearing urethra with no masses, tenderness or lesions              Bartholin's and Skene's: normal                 Vagina: normal appearing vagina with normal color and discharge, no lesions              Cervix: anteverted              Pap taken: Yes.   Bimanual Exam:  Uterus:  normal size, contour, position, consistency, mobility, non-tender              Adnexa: no mass, fullness, tenderness               Rectovaginal: not done               Anus:  Not done  Chaperone present: yes  A:  Well Woman with normal exam  Menopausal History of HSV I/ II - rare flare DM, HTN,Vit D deficiency S/P office [place] 10/2009 S/P colectomy with J pouch 11/08 for  Ulcerative Colitis History of Inflammatory Arthritis off Simponi currently X 1 yr             Vulvar biopsy shows eczema dermatitis  - not LSA 01/10/2016  Current yeast vaginitis  Travel insomnia   P:   Reviewed  health and wellness pertinent to exam  Pap smear: yes  Mammogram is due 11/18  Refill Temovate cream prn, refill on Triamcinolone and Nystatin to use BID for flare of yeast.  Diflucan 150 mg given for this flare as well.  Refill on Restoril 15 mg to use only prn # 30/0  Follow with Vit  D  Counseled on breast self exam, mammography screening, adequate intake of calcium and vitamin D, diet and exercise return annually or prn  An After Visit Summary was printed and given to the patient.

## 2016-12-24 NOTE — Patient Instructions (Signed)

## 2016-12-25 LAB — VITAMIN D 25 HYDROXY (VIT D DEFICIENCY, FRACTURES): Vit D, 25-Hydroxy: 16 ng/mL — ABNORMAL LOW (ref 30–100)

## 2016-12-25 NOTE — Progress Notes (Signed)
Reviewed personally.  M. Suzanne Kier Smead, MD.  

## 2016-12-26 LAB — IPS PAP TEST WITH HPV

## 2016-12-27 ENCOUNTER — Telehealth: Payer: Self-pay | Admitting: *Deleted

## 2016-12-27 MED ORDER — VITAMIN D (ERGOCALCIFEROL) 1.25 MG (50000 UNIT) PO CAPS: 50000.0000 [IU] | ORAL_CAPSULE | ORAL | 1 refills | Status: DC

## 2016-12-27 NOTE — Addendum Note (Signed)
Addended by: Abelino Derrick C on: 12/27/2016 10:25 AM   Modules accepted: Orders

## 2016-12-27 NOTE — Telephone Encounter (Signed)
Pt notified in result note.  Closing encounter. 

## 2016-12-27 NOTE — Telephone Encounter (Signed)
Return call to [place].

## 2016-12-27 NOTE — Telephone Encounter (Signed)
I have attempted to contact this patient by phone with the following results: left message to return call to [place] at [phone] on answering machine (mobile per [facility]).  No personal information given. [phone] (Mobile) *Preferred*

## 2016-12-27 NOTE — Telephone Encounter (Signed)
-----   Message from Regina Eck, CNM sent at 12/26/2016  2:14 PM EDT ----- Pap smear is  Negative with HPVHR  Not detected 02 No endos noted

## 2017-01-08 DIAGNOSIS — E669 Obesity, unspecified: Secondary | ICD-10-CM | POA: Insufficient documentation

## 2017-03-25 ENCOUNTER — Other Ambulatory Visit (INDEPENDENT_AMBULATORY_CARE_PROVIDER_SITE_OTHER): Payer: 59

## 2017-03-25 DIAGNOSIS — E559 Vitamin D deficiency, unspecified: Secondary | ICD-10-CM

## 2017-03-26 LAB — VITAMIN D 25 HYDROXY (VIT D DEFICIENCY, FRACTURES): VIT D 25 HYDROXY: 41.7 ng/mL (ref 30.0–100.0)

## 2017-04-03 ENCOUNTER — Telehealth: Payer: Self-pay | Admitting: Certified Nurse Midwife

## 2017-04-03 NOTE — Telephone Encounter (Signed)
Spoke with patient, advised of results and recommendations as seen below per Dr. Talbert Nan. Patient states she has Chron's, no longer has colon. Patient states Vit D has gone up and down over the years, how is absorption of Vitmain D affected given no colon? Should Vit D be checked more often?  Patient states she has f/u scheduled with PCP in less than a month, can discuss with then if no recommendations by Dr. Talbert Nan.   Advised patient would review with Dr. Talbert Nan and return call with recommendations, patient is agreeable.  Dr. Talbert Nan, please review and advise?       Notes recorded by Salvadore Dom, MD on 03/26/2017 at 4:52 PM EDT Results and any recommendations were sent via [place].   Hi Chelsey James, Your vit d level is now normal. You should now start taking 1,000 IU of vit D3 daily (long term). Please call the office with any questions. Chelsey Boast, MD (taking over for Edman Circle, NP

## 2017-04-03 NOTE — Telephone Encounter (Signed)
    Patient calling for lab results 

## 2017-04-04 NOTE — Telephone Encounter (Signed)
Spoke with patient, advised as seen below per Dr. Talbert Nan. Patient states she will discuss further with PCP, thankful for f/u.   Patient is agreeable to disposition. Will close encounter.

## 2017-04-04 NOTE — Telephone Encounter (Signed)
Vit d deficiency, more typically comes from issues with the stomach, small intestines, pancrease and hepatobiliary tree. I don't think her colectomy should cause vit d def. She can certainly discuss this with her primary as well.

## 2017-05-21 ENCOUNTER — Other Ambulatory Visit: Payer: Self-pay | Admitting: Obstetrics and Gynecology

## 2017-05-21 DIAGNOSIS — Z1231 Encounter for screening mammogram for malignant neoplasm of breast: Secondary | ICD-10-CM

## 2017-06-24 ENCOUNTER — Encounter: Payer: Self-pay | Admitting: Radiology

## 2017-06-24 ENCOUNTER — Ambulatory Visit
Admission: RE | Admit: 2017-06-24 | Discharge: 2017-06-24 | Disposition: A | Discharge status: Home / Self Care | Payer: 59 | Source: Ambulatory Visit | Attending: Obstetrics and Gynecology | Admitting: Obstetrics and Gynecology

## 2017-06-24 DIAGNOSIS — Z1231 Encounter for screening mammogram for malignant neoplasm of breast: Secondary | ICD-10-CM

## 2017-08-26 DIAGNOSIS — E782 Mixed hyperlipidemia: Secondary | ICD-10-CM | POA: Diagnosis not present

## 2017-08-26 DIAGNOSIS — E559 Vitamin D deficiency, unspecified: Secondary | ICD-10-CM | POA: Insufficient documentation

## 2017-08-26 DIAGNOSIS — E1169 Type 2 diabetes mellitus with other specified complication: Secondary | ICD-10-CM | POA: Diagnosis not present

## 2017-08-26 DIAGNOSIS — E119 Type 2 diabetes mellitus without complications: Secondary | ICD-10-CM | POA: Diagnosis not present

## 2017-09-11 DIAGNOSIS — H7292 Unspecified perforation of tympanic membrane, left ear: Secondary | ICD-10-CM | POA: Diagnosis not present

## 2017-09-11 DIAGNOSIS — H65112 Acute and subacute allergic otitis media (mucoid) (sanguinous) (serous), left ear: Secondary | ICD-10-CM | POA: Diagnosis not present

## 2017-09-11 DIAGNOSIS — R6889 Other general symptoms and signs: Secondary | ICD-10-CM | POA: Diagnosis not present

## 2017-09-25 DIAGNOSIS — H6982 Other specified disorders of Eustachian tube, left ear: Secondary | ICD-10-CM | POA: Diagnosis not present

## 2017-09-25 DIAGNOSIS — H9202 Otalgia, left ear: Secondary | ICD-10-CM | POA: Diagnosis not present

## 2017-11-02 DIAGNOSIS — Z23 Encounter for immunization: Secondary | ICD-10-CM | POA: Diagnosis not present

## 2017-12-24 DIAGNOSIS — E559 Vitamin D deficiency, unspecified: Secondary | ICD-10-CM | POA: Diagnosis not present

## 2017-12-24 DIAGNOSIS — E1169 Type 2 diabetes mellitus with other specified complication: Secondary | ICD-10-CM | POA: Diagnosis not present

## 2017-12-24 DIAGNOSIS — E1165 Type 2 diabetes mellitus with hyperglycemia: Secondary | ICD-10-CM | POA: Diagnosis not present

## 2017-12-24 DIAGNOSIS — E119 Type 2 diabetes mellitus without complications: Secondary | ICD-10-CM | POA: Diagnosis not present

## 2017-12-24 DIAGNOSIS — E782 Mixed hyperlipidemia: Secondary | ICD-10-CM | POA: Diagnosis not present

## 2017-12-24 DIAGNOSIS — Z9049 Acquired absence of other specified parts of digestive tract: Secondary | ICD-10-CM | POA: Insufficient documentation

## 2017-12-31 ENCOUNTER — Ambulatory Visit: Payer: 59 | Admitting: Nurse Practitioner

## 2018-01-01 ENCOUNTER — Ambulatory Visit (INDEPENDENT_AMBULATORY_CARE_PROVIDER_SITE_OTHER): Payer: 59 | Admitting: Obstetrics and Gynecology

## 2018-01-01 ENCOUNTER — Encounter: Payer: Self-pay | Admitting: Obstetrics and Gynecology

## 2018-01-01 ENCOUNTER — Other Ambulatory Visit: Payer: Self-pay

## 2018-01-01 VITALS — BP 100/70 | HR 76 | Resp 16 | Ht 61.5 in | Wt 156.0 lb

## 2018-01-01 DIAGNOSIS — E559 Vitamin D deficiency, unspecified: Secondary | ICD-10-CM | POA: Diagnosis not present

## 2018-01-01 DIAGNOSIS — N766 Ulceration of vulva: Secondary | ICD-10-CM | POA: Diagnosis not present

## 2018-01-01 DIAGNOSIS — N6322 Unspecified lump in the left breast, upper inner quadrant: Secondary | ICD-10-CM

## 2018-01-01 DIAGNOSIS — Z01419 Encounter for gynecological examination (general) (routine) without abnormal findings: Secondary | ICD-10-CM | POA: Diagnosis not present

## 2018-01-01 MED ORDER — VALACYCLOVIR HCL 500 MG PO TABS: ORAL_TABLET | ORAL | 1 refills | Status: DC

## 2018-01-01 NOTE — Patient Instructions (Addendum)
EXERCISE AND DIET:  We recommended that you start or continue a regular exercise program for good health. Regular exercise means any activity that makes your heart beat faster and makes you sweat.  We recommend exercising at least 30 minutes per day at least 3 days a week, preferably 4 or 5.  We also recommend a diet low in fat and sugar.  Inactivity, poor dietary choices and obesity can cause diabetes, heart attack, stroke, and kidney damage, among others.    ALCOHOL AND SMOKING:  Women should limit their alcohol intake to no more than 7 drinks/beers/glasses of wine (combined, not each!) per week. Moderation of alcohol intake to this level decreases your risk of breast cancer and liver damage. And of course, no recreational drugs are part of a healthy lifestyle.  And absolutely no smoking or even second hand smoke. Most people know smoking can cause heart and lung diseases, but did you know it also contributes to weakening of your bones? Aging of your skin?  Yellowing of your teeth and nails?  CALCIUM AND VITAMIN D:  Adequate intake of calcium and Vitamin D are recommended.  The recommendations for exact amounts of these supplements seem to change often, but generally speaking 600 mg of calcium (either carbonate or citrate) and 800 units of Vitamin D per day seems prudent. Certain women may benefit from higher intake of Vitamin D.  If you are among these women, your doctor will have told you during your visit.    PAP SMEARS:  Pap smears, to check for cervical cancer or precancers,  have traditionally been done yearly, although recent scientific advances have shown that most women can have pap smears less often.  However, every woman still should have a physical exam from her gynecologist every year. It will include a breast check, inspection of the vulva and vagina to check for abnormal growths or skin changes, a visual exam of the cervix, and then an exam to evaluate the size and shape of the uterus and  ovaries.  And after 52 years of age, a rectal exam is indicated to check for rectal cancers. We will also provide age appropriate advice regarding health maintenance, like when you should have certain vaccines, screening for sexually transmitted diseases, bone density testing, colonoscopy, mammograms, etc.   MAMMOGRAMS:  All women over 40 years old should have a yearly mammogram. Many facilities now offer a "3D" mammogram, which may cost around $50 extra out of pocket. If possible,  we recommend you accept the option to have the 3D mammogram performed.  It both reduces the number of women who will be called back for extra views which then turn out to be normal, and it is better than the routine mammogram at detecting truly abnormal areas.    COLONOSCOPY:  Colonoscopy to screen for colon cancer is recommended for all women at age 50.  We know, you hate the idea of the prep.  We agree, BUT, having colon cancer and not knowing it is worse!!  Colon cancer so often starts as a polyp that can be seen and removed at colonscopy, which can quite literally save your life!  And if your first colonoscopy is normal and you have no family history of colon cancer, most women don't have to have it again for 10 years.  Once every ten years, you can do something that may end up saving your life, right?  We will be happy to help you get it scheduled when you are ready.    Be sure to check your insurance coverage so you understand how much it will cost.  It may be covered as a preventative service at no cost, but you should check your particular policy.      Genital Herpes Genital herpes is a common sexually transmitted infection (STI) that is caused by a virus. The virus spreads from person to person through sexual contact. Infection can cause itching, blisters, and sores around the genitals or rectum. Symptoms may last several days and then go away This is called an outbreak. However, the virus remains in your body, so you may  have more outbreaks in the future. The time between outbreaks varies and can be months or years. Genital herpes affects men and women. It is particularly concerning for pregnant women because the virus can be passed to the baby during delivery and can cause serious problems. Genital herpes is also a concern for people who have a weak disease-fighting (immune) system. What are the causes? This condition is caused by the herpes simplex virus (HSV) type 1 or type 2. The virus may spread through:  Sexual contact with an infected person, including vaginal, anal, and oral sex.  Contact with fluid from a herpes sore.  The skin. This means that you can get herpes from an infected partner even if he or she does not have a visible sore or does not know that he or she is infected.  What increases the risk? You are more likely to develop this condition if:  You have sex with many partners.  You do not use latex condoms during sex.  What are the signs or symptoms? Most people do not have symptoms (asymptomatic) or have mild symptoms that may be mistaken for other skin problems. Symptoms may include:  Small red bumps near the genitals, rectum, or mouth. These bumps turn into blisters and then turn into sores.  Flu-like symptoms, including: ? Fever. ? Body aches. ? Swollen lymph nodes. ? Headache.  Painful urination.  Pain and itching in the genital area or rectal area.  Vaginal discharge.  Tingling or shooting pain in the legs and buttocks.  Generally, symptoms are more severe and last longer during the first (primary) outbreak. Flu-like symptoms are also more common during the primary outbreak. How is this diagnosed? Genital herpes may be diagnosed based on:  A physical exam.  Your medical history.  Blood tests.  A test of a fluid sample (culture) from an open sore.  How is this treated? There is no cure for this condition, but treatment with antiviral medicines that are taken by  mouth (orally) can do the following:  Speed up healing and relieve symptoms.  Help to reduce the spread of the virus to sexual partners.  Limit the chance of future outbreaks, or make future outbreaks shorter.  Lessen symptoms of future outbreaks.  Your health care provider may also recommend pain relief medicines, such as aspirin or ibuprofen. Follow these instructions at home: Sexual activity  Do not have sexual contact during active outbreaks.  Practice safe sex. Latex condoms and female condoms may help prevent the spread of the herpes virus. General instructions  Keep the affected areas dry and clean.  Take over-the-counter and prescription medicines only as told by your health care provider.  Avoid rubbing or touching blisters and sores. If you do touch blisters or sores: ? Wash your hands thoroughly with soap and water. ? Do not touch your eyes afterward.  To help relieve pain or itching,  you may take the following actions as directed by your health care provider: ? Apply a cold, wet cloth (cold compress) to affected areas 4-6 times a day. ? Apply a substance that protects your skin and reduces bleeding (astringent). ? Apply a gel that helps relieve pain around sores (lidocaine gel). ? Take a warm, shallow bath that cleans the genital area (sitz bath).  Keep all follow-up visits as told by your health care provider. This is important. How is this prevented?  Use condoms. Although anyone can get genital herpes during sexual contact, even with the use of a condom, a condom can provide some protection.  Avoid having multiple sexual partners.  Talk with your sexual partner about any symptoms either of you may have. Also, talk with your partner about any history of STIs.  Get tested for STIs before you have sex. Ask your partner to do the same.  Do not have sexual contact if you have symptoms of genital herpes. Contact a health care provider if:  Your symptoms are not  improving with medicine.  Your symptoms return.  You have new symptoms.  You have a fever.  You have abdominal pain.  You have redness, swelling, or pain in your eye.  You notice new sores on other parts of your body.  You are a woman and experience bleeding between menstrual periods.  You have had herpes and you become pregnant or plan to become pregnant. Summary  Genital herpes is a common sexually transmitted infection (STI) that is caused by the herpes simplex virus (HSV) type 1 or type 2.  These viruses are most often spread through sexual contact with an infected person.  You are more likely to develop this condition if you have sex with many partners or you have unprotected sex.  Most people do not have symptoms (asymptomatic) or have mild symptoms that may be mistaken for other skin problems. Symptoms occur as outbreaks that may happen months or years apart.  There is no cure for this condition, but treatment with oral antiviral medicines can reduce symptoms, reduce the chance of spreading the virus to a partner, prevent future outbreaks, or shorten future outbreaks. This information is not intended to replace advice given to you by your health care provider. Make sure you discuss any questions you have with your health care provider. Document Released: 07/26/2000 Document Revised: 06/28/2016 Document Reviewed: 06/28/2016 Elsevier Interactive Patient Education  [employer].

## 2018-01-01 NOTE — Progress Notes (Signed)
Patient scheduled while in office for left breast Dx MMG and [place], if needed. Scheduled at [place] on 01/02/18 arriving at 7:40am for 8am appt. Patient verbalizes understanding and is agreeable.

## 2018-01-01 NOTE — Progress Notes (Signed)
52 y.o. G0P0000 DivorcedCaucasianF here for annual exam. She c/o an area above her clitoris that has been irritated, open sore (like a cut) for the last 4 days. She has a h/o HSV, but never had an outbreak. H/O eczema on vulva, very occasional use of steroid ointment.  No vaginal bleeding. Not sexually active.     She has DM, last HgbA1C was 8 (up from 6.9).  H/O colon resection for Crohn's colitis. She is functioning fine.     Patient's last menstrual period was 03/12/2014 (approximate).          Sexually active: No.  The current method of family planning is post menopausal status.    Exercising: No.  The patient does not participate in regular exercise at present. Smoker:  no  Health Maintenance: Pap:  12-24-16 WNL NEG HR HPV 08-15-14 WNL NEG HR HPV  History of abnormal Pap:  Yes around age 8, had surgery on her cervix.  MMG:  06-24-17 WNL  Colonoscopy: N/A - Has a J Pouch  BMD:   Never TDaP:  12-20-15 Gardasil: N/A   reports that she has never smoked. She has never used smokeless tobacco. She reports that she drinks alcohol. She reports that she does not use drugs. Just occasional ETOH. Parents are elderly, will need help soon. She is a Corporate treasurer.   Past Medical History:  Diagnosis Date  . Anemia   . Crohn's   . Diabetes mellitus without complication ([place]) 16/10  . Inflammatory arthritis 06/2009  . STD (sexually transmitted disease) 8/04   pos HSV 2 serology  . Ulcerative colitis ([place])    [facility] History, Care Everywhere    Past Surgical History:  Procedure Laterality Date  . APPENDECTOMY  4/04   laparoscopic  . BREAST REDUCTION SURGERY Bilateral 1991  . COLON RESECTION  10/1998  . COLPOSCOPY  ~1990   cx dysplasia  . ESSURE TUBAL LIGATION Bilateral 10/2009   in office  . PARTIAL COLECTOMY  11/08   J Pouch inserted  . REDUCTION MAMMAPLASTY Bilateral   . SHOULDER ARTHROSCOPY W/ ACROMIAL REPAIR Right 09/2011   and removal of bone spur  . TONSILLECTOMY  child     Current Outpatient Medications  Medication Sig Dispense Refill  . Cinnamon 500 MG TABS Take 2 tablets by mouth daily.    . clobetasol ointment (TEMOVATE) 0.05 % Apply to the area one time per day. 30 g 2  . glipiZIDE (GLUCOTROL) 10 MG tablet Take 1 tablet by mouth 2 (two) times daily.  0  . loperamide (IMODIUM) 2 MG capsule Take 2 mg by mouth 4 (four) times daily as needed. For constipation     . nystatin cream (MYCOSTATIN) Apply 1 application topically 2 (two) times daily. Apply to affected area BID prn 30 g 2  . PSYLLIUM HUSK PO Take 5 capsules by mouth 3 (three) times daily.    Marland Kitchen triamcinolone (KENALOG) 0.025 % ointment Apply 1 application topically 2 (two) times daily. 30 g 2  . Turmeric 500 MG CAPS Take 2 capsules by mouth daily.     No current facility-administered medications for this visit.     Family History  Adopted: Yes  Problem Relation Age of Onset  . Diabetes Maternal Grandmother   . Diabetes Maternal Grandfather   . Diabetes Paternal Grandmother   . Diabetes Paternal Grandfather     Review of Systems  Constitutional: Negative.   HENT: Negative.   Eyes: Negative.   Respiratory: Negative.   Cardiovascular:  Negative.   Gastrointestinal: Negative.   Endocrine: Negative.   Genitourinary: Negative.   Musculoskeletal: Negative.   Skin: Negative.   Allergic/Immunologic: Negative.   Neurological: Negative.   Psychiatric/Behavioral: Negative.     Exam:   BP 100/70 (BP Location: Right Arm, Patient Position: Sitting, Cuff Size: Normal)   Pulse 76   Resp 16   Ht 5' 1.5" (1.562 m)   Wt 156 lb (70.8 kg)   LMP 03/12/2014 (Approximate)   BMI 29.00 kg/m   Weight change: @WEIGHTCHANGE @ Height:   Height: 5' 1.5" (156.2 cm)  Ht Readings from Last 3 Encounters:  01/01/18 5' 1.5" (1.562 m)  12/24/16 5' 1.5" (1.562 m)  12/20/15 5\' 1"  (1.549 m)    General appearance: alert, cooperative and appears stated age Head: Normocephalic, without obvious abnormality,  atraumatic Neck: no adenopathy, supple, symmetrical, trachea midline and thyroid normal to inspection and palpation Lungs: clear to auscultation bilaterally Cardiovascular: regular rate and rhythm Breasts: evidence of bilateral breast reduction. Prominant ridge of tissue in the left breast at 11 o'clock near the periphery, 1.5 cm. No other lumps, no skin changes.  Abdomen: soft, non-tender; non distended,  no masses,  no organomegaly Extremities: extremities normal, atraumatic, no cyanosis or edema Skin: Skin color, texture, turgor normal. No rashes or lesions Lymph nodes: Cervical, supraclavicular, and axillary nodes normal. No abnormal inguinal nodes palpated Neurologic: Grossly normal   Pelvic: External genitalia:  no lesions              Urethra:  normal appearing urethra with no masses, tenderness or lesions              Bartholins and Skenes: normal                 Vagina: normal appearing vagina with normal color and discharge, no lesions              Cervix: no lesions               Bimanual Exam:  Uterus:  normal size, contour, position, consistency, mobility, non-tender              Adnexa: no mass, fullness, tenderness               Rectovaginal: declines               Anus:  Perianal erythema, anal tags.  Chaperone was present for exam.  A:  Well Woman with normal exam  Vit d def (currently on 1,000 IU a day)  Left breast lump at 11 o'clock  Vulvar ulcer, c/w hsv  P:   No pap this year  Mammogram is UTD  Vit D  Other labs with primary  No colonoscopy needed  Discussed breast self exam  Discussed calcium and vit D intake  HSV PCR

## 2018-01-02 ENCOUNTER — Ambulatory Visit
Admission: RE | Admit: 2018-01-02 | Discharge: 2018-01-02 | Disposition: A | Discharge status: Home / Self Care | Payer: 59 | Source: Ambulatory Visit | Attending: Obstetrics and Gynecology | Admitting: Obstetrics and Gynecology

## 2018-01-02 DIAGNOSIS — N6489 Other specified disorders of breast: Secondary | ICD-10-CM | POA: Diagnosis not present

## 2018-01-02 DIAGNOSIS — R928 Other abnormal and inconclusive findings on diagnostic imaging of breast: Secondary | ICD-10-CM | POA: Diagnosis not present

## 2018-01-02 DIAGNOSIS — N6322 Unspecified lump in the left breast, upper inner quadrant: Secondary | ICD-10-CM

## 2018-01-02 LAB — VITAMIN D 25 HYDROXY (VIT D DEFICIENCY, FRACTURES): VIT D 25 HYDROXY: 25.9 ng/mL — AB (ref 30.0–100.0)

## 2018-01-06 ENCOUNTER — Telehealth: Payer: Self-pay | Admitting: *Deleted

## 2018-01-06 NOTE — Telephone Encounter (Signed)
-----   Message from Salvadore Dom, MD sent at 01/06/2018  7:41 AM EDT ----- Please schedule her for a 3 month breast check and take her out of mammogram hold.

## 2018-01-06 NOTE — Telephone Encounter (Signed)
Notes recorded by Burnice Logan, RN on 01/06/2018 at 3:05 PM EDT Spoke with patient. Patient declined to schedule breast recheck. Patient states she is aware of the area, will do monthly self breast exams and call if any changes or concerns. Reviewed with patient importance of recheck with provider, patient again declined OV. Advised will update Dr. Talbert Nan and return call if any additional recommendations. See telephone encounter dated 01/06/18 to review with provider.  Dr. Talbert Nan -please review.

## 2018-01-07 LAB — HSV NAA
HSV 1 NAA: NEGATIVE
HSV 2 NAA: NEGATIVE

## 2018-01-23 DIAGNOSIS — J0100 Acute maxillary sinusitis, unspecified: Secondary | ICD-10-CM | POA: Diagnosis not present

## 2018-01-23 DIAGNOSIS — R05 Cough: Secondary | ICD-10-CM | POA: Diagnosis not present

## 2018-03-30 DIAGNOSIS — C44622 Squamous cell carcinoma of skin of right upper limb, including shoulder: Secondary | ICD-10-CM | POA: Diagnosis not present

## 2018-06-09 DIAGNOSIS — Z85828 Personal history of other malignant neoplasm of skin: Secondary | ICD-10-CM | POA: Diagnosis not present

## 2018-09-12 ENCOUNTER — Emergency Department
Admission: EM | Admit: 2018-09-12 | Discharge: 2018-09-12 | Disposition: A | Discharge status: Home / Self Care | Payer: 59 | Source: Home / Self Care

## 2018-09-12 ENCOUNTER — Other Ambulatory Visit: Payer: Self-pay

## 2018-09-12 ENCOUNTER — Encounter: Payer: Self-pay | Admitting: Family Medicine

## 2018-09-12 DIAGNOSIS — R69 Illness, unspecified: Secondary | ICD-10-CM | POA: Diagnosis not present

## 2018-09-12 DIAGNOSIS — R05 Cough: Secondary | ICD-10-CM | POA: Diagnosis not present

## 2018-09-12 DIAGNOSIS — J111 Influenza due to unidentified influenza virus with other respiratory manifestations: Secondary | ICD-10-CM

## 2018-09-12 MED ORDER — OSELTAMIVIR PHOSPHATE 75 MG PO CAPS: 75.0000 mg | ORAL_CAPSULE | Freq: Two times a day (BID) | ORAL | 0 refills | Status: DC

## 2018-09-12 NOTE — ED Triage Notes (Signed)
On flights back from [facility] Wednesday, tired all day Thursday.  No fever Friday, but coughing.  Fever last night and this afternoon around 2.

## 2018-09-12 NOTE — ED Provider Notes (Signed)
[facility]    CSN: [id number] Arrival date & time: 09/12/18  1550     History   Chief Complaint Chief Complaint  Patient presents with  . Cough  . Fever  . Generalized Body Aches    HPI Chelsey James is a 53 y.o. female.   This is the initial [place] urgent care visit for this 53 year old woman.  On flights back from [facility] Wednesday, tired all day Thursday.  No fever Friday, but coughing.  Fever last night and this afternoon around 2.  Patient works at [employer] in [place].  She has chronic nasal congestion and also is being treated for chronic Crohn's disease     Past Medical History:  Diagnosis Date  . Anemia   . Crohn's   . Diabetes mellitus without complication ([place]) 00/92  . Inflammatory arthritis 06/2009  . STD (sexually transmitted disease) 8/04   pos HSV 2 serology  . Ulcerative colitis ([place])    [facility] History, Care Everywhere    Patient Active Problem List   Diagnosis Date Noted  . 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 APPENDECTOMY  4/04   laparoscopic  . BREAST REDUCTION SURGERY Bilateral 1991  . COLON RESECTION  10/1998  . COLPOSCOPY  ~1990   cx dysplasia  . ESSURE TUBAL LIGATION Bilateral 10/2009   in office  . PARTIAL COLECTOMY  11/08   J Pouch inserted  . REDUCTION MAMMAPLASTY Bilateral   . SHOULDER ARTHROSCOPY W/ ACROMIAL REPAIR Right 09/2011   and removal of bone spur  . TONSILLECTOMY  child    OB History    Gravida  0   Para  0   Term  0   Preterm  0   AB  0   Living  0     SAB  0   TAB  0   Ectopic  0   Multiple  0   Live Births  0            Home Medications    Prior to Admission medications   Medication Sig Start Date End Date Taking? Authorizing Provider  Cinnamon 500 MG TABS Take 2 tablets by mouth daily.    [provider]  clobetasol ointment (TEMOVATE) 0.05 % Apply to the area one time per day. 12/24/16   Kem Boroughs, FNP  glipiZIDE (GLUCOTROL) 10 MG tablet Take 1 tablet by mouth 2 (two) times daily. 12/09/16   [provider]  loperamide (IMODIUM) 2 MG capsule Take 2 mg by mouth 4 (four) times daily as needed. For constipation     [provider]  nystatin cream (MYCOSTATIN) Apply 1 application topically 2 (two) times daily. Apply to affected area BID prn 12/24/16   Kem Boroughs, FNP  oseltamivir (TAMIFLU) 75 MG capsule Take 1 capsule (75 mg total) by mouth every 12 (twelve) hours. 09/12/18   Robyn Haber, MD  PSYLLIUM HUSK PO Take 5 capsules by mouth 3 (three) times daily.    [provider]  triamcinolone (KENALOG) 0.025 % ointment Apply 1 application topically 2 (two) times daily. 12/24/16   Kem Boroughs, FNP  Turmeric 500 MG CAPS Take 2 capsules by mouth daily.    [provider]  valACYclovir (VALTREX) 500 MG tablet 1 tablet BID x 3 days as needed 01/01/18   Salvadore Dom, MD    Family History Family History  Adopted: Yes  Problem Relation Age of Onset  .  Diabetes Maternal Grandmother   . Diabetes Maternal Grandfather   . Diabetes Paternal Grandmother   . Diabetes Paternal Grandfather     Social History Social History   Tobacco Use  . Smoking status: Never Smoker  . Smokeless tobacco: Never Used  Substance Use Topics  . Alcohol use: Yes    Alcohol/week: 0.0 standard drinks  . Drug use: No     Allergies   Ciprofloxacin and Valium   Review of Systems Review of Systems   Physical Exam Triage Vital Signs ED Triage Vitals  Enc Vitals Group     BP      Pulse      Resp      Temp      Temp src      SpO2      Weight      Height      Head Circumference      Peak Flow      Pain Score      Pain Loc      Pain Edu?      Excl. in [place]?    No data found.  Updated Vital Signs BP 122/64 (BP Location: Right Arm)   Pulse (!) 119   Temp (!) 100.5 F (38.1 C) (Oral)   Resp 20   Ht 5\' 1"  (1.549 m)   Wt 70.3 kg   LMP  03/12/2014 (Approximate)   SpO2 95%   BMI 29.29 kg/m   Physical Exam Vitals signs and nursing note reviewed.  Constitutional:      Appearance: Normal appearance. She is normal weight.  HENT:     Head: Normocephalic.     Right Ear: Tympanic membrane normal.     Left Ear: Tympanic membrane normal.     Nose: Congestion present.     Mouth/Throat:     Mouth: Mucous membranes are moist.     Pharynx: Oropharynx is clear.  Eyes:     Conjunctiva/sclera: Conjunctivae normal.  Neck:     Musculoskeletal: Normal range of motion and neck supple.  Cardiovascular:     Rate and Rhythm: Normal rate and regular rhythm.  Pulmonary:     Effort: Pulmonary effort is normal.     Breath sounds: Normal breath sounds.  Musculoskeletal: Normal range of motion.  Skin:    General: Skin is warm and dry.  Neurological:     General: No focal deficit present.     Mental Status: She is alert and oriented to person, place, and time.  Psychiatric:        Mood and Affect: Mood normal.      UC Treatments / Results  Labs (all labs ordered are listed, but only abnormal results are displayed) Labs Reviewed - No data to display  EKG None  Radiology No results found.  Procedures Procedures (including critical care time)  Medications Ordered in UC Medications - No data to display  Initial Impression / Assessment and Plan / UC Course  I have reviewed the triage vital signs and the nursing notes.  Pertinent labs & imaging results that were available during my care of the patient were reviewed by me and considered in my medical decision making (see chart for details).    Final Clinical Impressions(s) / UC Diagnoses   Final diagnoses:  Influenza-like illness   Discharge Instructions   None    ED Prescriptions    Medication Sig Dispense Auth. Provider   oseltamivir (TAMIFLU) 75 MG capsule Take 1 capsule (75 mg total)  by mouth every 12 (twelve) hours. 10 capsule Robyn Haber, MD      Controlled Substance Prescriptions [place] Controlled Substance Registry consulted? Not Applicable   Robyn Haber, MD 09/12/18 [phone]

## 2018-09-25 ENCOUNTER — Other Ambulatory Visit: Payer: Self-pay | Admitting: Obstetrics and Gynecology

## 2018-09-25 NOTE — Telephone Encounter (Signed)
Call to patient. Patient states that she has eczema and feels she's having a flare down there. States she has a history of eczema and the inflammation and scar tissue has "changed my anatomy." Patient states she went to look down there a couple days ago and noticed that her right labia was "weird looking and almost looked like a finger hanging off." Patient states its about 3-4 mm and thin. No pain. RN advised OV recommended for further evaluation. Patient scheduled for 09-29-2018 at 1015 with Dr. Talbert Nan. Patient agreeable to date and time of appointment. Patient also requesting a refill on eczema creams because they have expired. States she needs a refill on triamcinolone, nystatin and clobetasol all called in individually as that is a cheaper option. Pharmacy confirmed as [facility] on [address] in [facility]. Patient advised Dr. Talbert Nan is out of the office today and response may not be immediate.   Medications pended for triamcinolone, nystatin and clobetasol. #30g, 0RF for all. Please advise.

## 2018-09-25 NOTE — Telephone Encounter (Signed)
Patient has had a change in shape of her areola and would like to speak with a nurse regarding whether she should be seen or not.

## 2018-09-27 ENCOUNTER — Telehealth: Payer: Self-pay | Admitting: Obstetrics and Gynecology

## 2018-09-27 MED ORDER — NYSTATIN 100000 UNIT/GM EX CREA: 1.0000 "application " | TOPICAL_CREAM | Freq: Two times a day (BID) | CUTANEOUS | 0 refills | Status: AC

## 2018-09-27 MED ORDER — CLOBETASOL PROPIONATE 0.05 % EX OINT: TOPICAL_OINTMENT | CUTANEOUS | 0 refills | Status: AC

## 2018-09-27 NOTE — Telephone Encounter (Signed)
Please let the patient know that I sent in the nystatin and clobetasol, the kenalog is basically a duplicate (both clobetasol and kenalog are steroids).

## 2018-09-29 ENCOUNTER — Encounter: Payer: Self-pay | Admitting: Obstetrics and Gynecology

## 2018-09-29 ENCOUNTER — Other Ambulatory Visit: Payer: Self-pay

## 2018-09-29 ENCOUNTER — Ambulatory Visit: Payer: 59 | Admitting: Obstetrics and Gynecology

## 2018-09-29 VITALS — BP 102/72 | HR 60 | Wt 153.4 lb

## 2018-09-29 DIAGNOSIS — N9089 Other specified noninflammatory disorders of vulva and perineum: Secondary | ICD-10-CM

## 2018-09-29 DIAGNOSIS — Z01419 Encounter for gynecological examination (general) (routine) without abnormal findings: Secondary | ICD-10-CM

## 2018-09-29 NOTE — Progress Notes (Signed)
GYNECOLOGY  VISIT   HPI: 53 y.o.   Divorced White or Caucasian Not Hispanic or Latino  female   G0P0000 with Patient's last menstrual period was 03/12/2014 (approximate).   here for change to right side of labia. Patient states that it appears to be a "growth." She has noticed changes with her labia with her vulvar eczema and menopause. Recently noticed a change on the right labia minora, outgrowth that wasn't there before.   GYNECOLOGIC HISTORY: Patient's last menstrual period was 03/12/2014 (approximate). Contraception: Postmenopausal Menopausal hormone therapy: None        OB History    Gravida  0   Para  0   Term  0   Preterm  0   AB  0   Living  0     SAB  0   TAB  0   Ectopic  0   Multiple  0   Live Births  0              Patient Active Problem List   Diagnosis Date Noted  . 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 Anemia   . Crohn's   . Diabetes mellitus without complication ([place]) 42/59  . Inflammatory arthritis 06/2009  . Squamous cell skin cancer    arm  . STD (sexually transmitted disease) 8/04   pos HSV 2 serology  . Ulcerative colitis ([place])    [facility] History, Care Everywhere    Past Surgical History:  Procedure Laterality Date  . APPENDECTOMY  4/04   laparoscopic  . BREAST REDUCTION SURGERY Bilateral 1991  . COLON RESECTION  10/1998  . COLPOSCOPY  ~1990   cx dysplasia  . ESSURE TUBAL LIGATION Bilateral 10/2009   in office  . PARTIAL COLECTOMY  11/08   J Pouch inserted  . REDUCTION MAMMAPLASTY Bilateral   . SHOULDER ARTHROSCOPY W/ ACROMIAL REPAIR Right 09/2011   and removal of bone spur  . TONSILLECTOMY  child    Current Outpatient Medications  Medication Sig Dispense Refill  . Cinnamon 500 MG TABS Take 2 tablets by mouth daily.    Marland Kitchen glipiZIDE (GLUCOTROL) 10 MG tablet Take 1 tablet by mouth 2 (two) times daily.  0  . loperamide (IMODIUM) 2 MG capsule Take 2 mg by mouth 4 (four) times daily as  needed. For constipation     . PSYLLIUM HUSK PO Take 5 capsules by mouth 3 (three) times daily.    . Turmeric 500 MG CAPS Take 2 capsules by mouth daily.    . valACYclovir (VALTREX) 500 MG tablet 1 tablet BID x 3 days as needed 30 tablet 1  . clobetasol ointment (TEMOVATE) 0.05 % Apply to the area one time per day. (Patient not taking: Reported on 09/29/2018) 30 g 0  . nystatin cream (MYCOSTATIN) Apply 1 application topically 2 (two) times daily. Apply to affected area BID prn (Patient not taking: Reported on 09/29/2018) 30 g 0  . triamcinolone (KENALOG) 0.025 % ointment Apply 1 application topically 2 (two) times daily. (Patient not taking: Reported on 09/29/2018) 30 g 2   No current facility-administered medications for this visit.      ALLERGIES: Ciprofloxacin and Valium  Family History  Adopted: Yes  Problem Relation Age of Onset  . Diabetes Maternal Grandmother   . Diabetes Maternal Grandfather   . Diabetes Paternal Grandmother   . Diabetes Paternal Grandfather     Social History   Socioeconomic History  . Marital  status: Divorced    Spouse name: Not on file  . Number of children: Not on file  . Years of education: Not on file  . Highest education level: Not on file  Occupational History  . Not on file  Social Needs  . Financial resource strain: Not on file  . Food insecurity:    Worry: Not on file    Inability: Not on file  . Transportation needs:    Medical: Not on file    Non-medical: Not on file  Tobacco Use  . Smoking status: Never Smoker  . Smokeless tobacco: Never Used  Substance and Sexual Activity  . Alcohol use: Yes    Alcohol/week: 0.0 standard drinks  . Drug use: No  . Sexual activity: Not Currently    Partners: Male    Birth control/protection: Post-menopausal    Comment: [place]  Lifestyle  . Physical activity:    Days per week: Not on file    Minutes per session: Not on file  . Stress: Not on file  Relationships  . Social connections:    Talks  on phone: Not on file    Gets together: Not on file    Attends religious service: Not on file    Active member of club or organization: Not on file    Attends meetings of clubs or organizations: Not on file    Relationship status: Not on file  . Intimate partner violence:    Fear of current or ex partner: Not on file    Emotionally abused: Not on file    Physically abused: Not on file    Forced sexual activity: Not on file  Other Topics Concern  . Not on file  Social History Narrative   Lives alone.  Now new job with the [employer] in [employer].  Some travel to [facility] twice a year.    Review of Systems  Constitutional: Negative.   HENT: Negative.   Eyes: Negative.   Respiratory: Negative.   Cardiovascular: Negative.   Gastrointestinal: Negative.   Genitourinary:       Change in shape to right side of labia  Musculoskeletal: Negative.   Skin: Negative.   Neurological: Negative.   Endo/Heme/Allergies: Negative.   Psychiatric/Behavioral: Negative.     PHYSICAL EXAMINATION:    BP 102/72 (BP Location: Right Arm, Patient Position: Sitting, Cuff Size: Normal)   Pulse 60   Wt 153 lb 6.4 oz (69.6 kg)   LMP 03/12/2014 (Approximate)   BMI 28.98 kg/m     General appearance: alert, cooperative and appears stated age   Pelvic: External genitalia:  Atrophic appearing, no lesions, the right labia minora more irregular boarder than the left, no worrisome features, no lesions.               Urethra:  normal appearing urethra with no masses, tenderness or lesions              Bartholins and Skenes: normal                 Vagina: entry appears atrophic  Chaperone was present for exam.  ASSESSMENT Patient has noticed some changes to her labia minora, nothing concerning noted on exam    PLAN Call with changes or if she develops irritation or itching F/u for her annual exam in a few months   An After Visit Summary was printed and given to the patient.

## 2018-09-29 NOTE — Telephone Encounter (Signed)
Patient notified at her appointment on 09/29/2018. Encounter closed.

## 2019-01-26 ENCOUNTER — Ambulatory Visit: Payer: 59 | Admitting: Obstetrics and Gynecology

## 2019-02-22 ENCOUNTER — Encounter: Payer: Self-pay | Admitting: Obstetrics and Gynecology

## 2019-02-22 ENCOUNTER — Other Ambulatory Visit: Payer: Self-pay

## 2019-02-22 ENCOUNTER — Ambulatory Visit (INDEPENDENT_AMBULATORY_CARE_PROVIDER_SITE_OTHER): Payer: 59 | Admitting: Obstetrics and Gynecology

## 2019-02-22 VITALS — BP 112/70 | HR 72 | Temp 98.2°F | Ht 61.5 in | Wt 152.8 lb

## 2019-02-22 DIAGNOSIS — Z01419 Encounter for gynecological examination (general) (routine) without abnormal findings: Secondary | ICD-10-CM | POA: Diagnosis not present

## 2019-02-22 NOTE — Patient Instructions (Signed)

## 2019-02-22 NOTE — Progress Notes (Signed)
53 y.o. G0P0000 Divorced White or Caucasian Not Hispanic or Latino female here for annual exam.  She has been working from home, isolated with Covid. Some depression off and on. Some anxiety. She has a close friend, good support.  Not sexually active. No vaginal bleeding.   H/O eczematous dermatitis on the vulva  H/O DM H/O colon resection for Crohn's colitis. Stool can be watery or loose, typically has 5-10 BM a day.     Patient's last menstrual period was 03/12/2014 (approximate).          Sexually active: No.  The current method of family planning is post menopausal status.    Exercising: No.  The patient does not participate in regular exercise at present. Smoker:  no  Health Maintenance: Pap:     12/24/2016 WNL NEG HPV, 08/15/14 Negative with neg HR HPV History of Abnormal Pap: yes, in early 20's MMG: 01/02/2018 Birads 1 negative, was due for screening in 06/2018        Colonoscopy: No colon per patient BMD: 2008 per patient, normal  TDaP: 12/20/15   reports that she has never smoked. She has never used smokeless tobacco. She reports current alcohol use. She reports that she does not use drugs. Parents are elderly, local, multiple medical issues. She is a Corporate treasurer.    Past Medical History:  Diagnosis Date  . Anemia   . Crohn's   . Diabetes mellitus without complication ([place]) 06/30  . Inflammatory arthritis 06/2009  . Squamous cell skin cancer    arm  . STD (sexually transmitted disease) 8/04   pos HSV 2 serology  . Ulcerative colitis ([place])    [facility] History, Care Everywhere    Past Surgical History:  Procedure Laterality Date  . APPENDECTOMY  4/04   laparoscopic  . BREAST REDUCTION SURGERY Bilateral 1991  . COLON RESECTION  10/1998  . COLPOSCOPY  ~1990   cx dysplasia  . ESSURE TUBAL LIGATION Bilateral 10/2009   in office  . PARTIAL COLECTOMY  11/08   J Pouch inserted  . REDUCTION MAMMAPLASTY Bilateral   . SHOULDER ARTHROSCOPY W/ ACROMIAL REPAIR Right 09/2011   and removal of bone spur  . TONSILLECTOMY  child    Current Outpatient Medications  Medication Sig Dispense Refill  . Cinnamon 500 MG TABS Take 2 tablets by mouth daily.    . clobetasol ointment (TEMOVATE) 0.05 % Apply to the area one time per day. 30 g 0  . loperamide (IMODIUM) 2 MG capsule Take 2 mg by mouth 4 (four) times daily as needed. For constipation     . nystatin cream (MYCOSTATIN) Apply 1 application topically 2 (two) times daily. Apply to affected area BID prn 30 g 0  . PSYLLIUM HUSK PO Take 5 capsules by mouth 3 (three) times daily.    Marland Kitchen triamcinolone (KENALOG) 0.025 % ointment Apply 1 application topically 2 (two) times daily. 30 g 2  . Turmeric 500 MG CAPS Take 2 capsules by mouth daily.    . valACYclovir (VALTREX) 500 MG tablet 1 tablet BID x 3 days as needed 30 tablet 1   No current facility-administered medications for this visit.   No hsv outbreaks, hasn't needed the valtrex.   Family History  Adopted: Yes  Problem Relation Age of Onset  . Diabetes Maternal Grandmother   . Diabetes Maternal Grandfather   . Diabetes Paternal Grandmother   . Diabetes Paternal Grandfather     Review of Systems  Constitutional: Negative.   HENT:  Negative.   Eyes: Negative.   Respiratory: Negative.   Cardiovascular: Negative.   Gastrointestinal: Negative.   Endocrine: Negative.   Genitourinary: Negative.   Musculoskeletal: Negative.   Skin: Negative.   Allergic/Immunologic: Negative.   Neurological: Negative.   Hematological: Negative.   Psychiatric/Behavioral: The patient is nervous/anxious.        Depression     Exam:   BP 112/70 (BP Location: Right Arm, Patient Position: Sitting, Cuff Size: Normal)   Pulse 72   Temp 98.2 F (36.8 C) (Skin)   Ht 5' 1.5" (1.562 m)   Wt 152 lb 12.8 oz (69.3 kg)   LMP 03/12/2014 (Approximate)   BMI 28.40 kg/m   Weight change: @WEIGHTCHANGE @ Height:   Height: 5' 1.5" (156.2 cm)  Ht Readings from Last 3 Encounters:  02/22/19 5' 1.5"  (1.562 m)  09/12/18 5\' 1"  (1.549 m)  01/01/18 5' 1.5" (1.562 m)    General appearance: alert, cooperative and appears stated age Head: Normocephalic, without obvious abnormality, atraumatic Neck: no adenopathy, supple, symmetrical, trachea midline and thyroid normal to inspection and palpation Lungs: clear to auscultation bilaterally Cardiovascular: regular rate and rhythm Breasts: normal appearance, no masses or tenderness Abdomen: soft, non-tender; non distended,  no masses,  no organomegaly Extremities: extremities normal, atraumatic, no cyanosis or edema Skin: Skin color, texture, turgor normal. No rashes or lesions Lymph nodes: Cervical, supraclavicular, and axillary nodes normal. No abnormal inguinal nodes palpated Neurologic: Grossly normal   Pelvic: External genitalia:  no lesions              Urethra:  normal appearing urethra with no masses, tenderness or lesions              Bartholins and Skenes: normal                 Vagina: atrophic appearing vagina with normal color and discharge, no lesions              Cervix: no lesions               Bimanual Exam:  Uterus:  normal size, contour, position, consistency, mobility, non-tender              Adnexa: no mass, fullness, tenderness               Rectovaginal: deferred  Chaperone was present for exam.  A:  Well Woman with normal exam    P:   No pap this year  Labs with primary  Mammogram in 11/20  Doesn't need colonoscopy  Discussed breast self exam  Discussed calcium and vit D intake

## 2019-04-13 ENCOUNTER — Telehealth: Payer: Self-pay | Admitting: Obstetrics and Gynecology

## 2019-04-13 NOTE — Telephone Encounter (Signed)
Spoke with patient. Seen for AEX 02/22/19. Patient states she discussed anxiety and not being able to sleep well at last AEX. Patient reports anxiety has improved, but still not resting well, has "busy brain". Has taken Restoril 15mg  PO in the past, asking for RX or alternative recommendations. Patient is aware medication is not long term and is requesting something PRN. Advised I will have to review with Dr. Talbert Nan and return call with recommendations, patient agreeable.   Pharmacy on file confirmed.   Dr. Talbert Nan -please advise.

## 2019-04-13 NOTE — Telephone Encounter (Signed)
She will need to get that type from her primary.

## 2019-04-13 NOTE — Telephone Encounter (Signed)
Patient discussed not sleeping well and anxiety at last visit. The anxiety has gotten better but she would like something to help her sleep.

## 2019-04-16 ENCOUNTER — Telehealth: Payer: Self-pay | Admitting: Obstetrics and Gynecology

## 2019-04-16 NOTE — Telephone Encounter (Signed)
Return call to patient. Reviewed message from Dr Talbert Nan and previous encounter.  States she does not see PCP Scherrie Gerlach, PA any longer. Declines antidepressant. States anxiety is a little better. Just not able to sleep at night. Declined office visit. Will try over the counter Benadryl and call back if this does not help.  Routing to Dr Talbert Nan. Encounter closed.

## 2019-04-16 NOTE — Telephone Encounter (Signed)
Patient following up after conversation with Sharee Pimple on Tuesday 9/1 (see closed encounter). States she does not have a PCP and had been offered an anti-depressant previously by Dr. Talbert Nan. States she has still been unable to sleep and is extremely exhausted.

## 2019-08-13 HISTORY — PX: OTHER SURGICAL HISTORY: SHX169

## 2019-11-01 ENCOUNTER — Encounter: Payer: Self-pay | Admitting: Certified Nurse Midwife

## 2019-12-14 ENCOUNTER — Other Ambulatory Visit: Payer: Self-pay | Admitting: Obstetrics and Gynecology

## 2019-12-14 ENCOUNTER — Other Ambulatory Visit: Payer: Self-pay | Admitting: Family Medicine

## 2019-12-14 DIAGNOSIS — Z1231 Encounter for screening mammogram for malignant neoplasm of breast: Secondary | ICD-10-CM

## 2019-12-15 ENCOUNTER — Ambulatory Visit
Admission: RE | Admit: 2019-12-15 | Discharge: 2019-12-15 | Disposition: A | Discharge status: Home / Self Care | Payer: 59 | Source: Ambulatory Visit | Attending: Family Medicine | Admitting: Family Medicine

## 2019-12-15 ENCOUNTER — Other Ambulatory Visit: Payer: Self-pay

## 2019-12-15 DIAGNOSIS — Z1231 Encounter for screening mammogram for malignant neoplasm of breast: Secondary | ICD-10-CM

## 2020-02-17 DIAGNOSIS — S4992XA Unspecified injury of left shoulder and upper arm, initial encounter: Secondary | ICD-10-CM | POA: Insufficient documentation

## 2020-02-24 ENCOUNTER — Ambulatory Visit: Payer: 59 | Admitting: Obstetrics and Gynecology

## 2020-03-01 ENCOUNTER — Ambulatory Visit: Payer: 59 | Admitting: Obstetrics and Gynecology

## 2020-06-12 NOTE — Progress Notes (Signed)
54 y.o. G0P0000 Divorced White or Caucasian Not Hispanic or Latino female here for annual exam. No vaginal bleeding.  Not sexually active.     Patient lost her mother in September. She and her Dad are both struggling. She has been helping her parents.  She has supportive friends. She has good days and bad days.   She is having shoulder surgery next week.   H/O eczematous dermatitis on the vulva  H/O DM, diet controlled. Hasn't been checking her FS regularly. No weight gain. Trying to eat healthy.   H/O colon resection for Crohn's colitis. Stool can be watery or loose. No bladder c/o.    Patient's last menstrual period was 03/12/2014 (approximate).          Sexually active: No.  The current method of family planning is post menopausal status.    Exercising: No.   Smoker:  no  Health Maintenance: Pap:12/24/2016 WNL NEG HPV, 08/15/14 Negative with neg HR HPV History of abnormal Pap:  Yes early 20's, cryosurgery.   MMG:  12/15/19  Density Cat A - Birads 1 Neg  BMD:   None  Colonoscopy: does not have a colon  TDaP:  12/20/15  Gardasil: NA   reports that she has never smoked. She has never used smokeless tobacco. She reports current alcohol use. She reports that she does not use drugs. Rare ETOH. She is a Corporate treasurer.  Past Medical History:  Diagnosis Date  . Anemia   . Crohn's   . Diabetes mellitus without complication ([place]) 18/29  . Inflammatory arthritis 06/2009  . Squamous cell skin cancer    arm  . STD (sexually transmitted disease) 8/04   pos HSV 2 serology  . Ulcerative colitis ([place])    [facility] History, Care Everywhere    Past Surgical History:  Procedure Laterality Date  . APPENDECTOMY  4/04   laparoscopic  . BREAST REDUCTION SURGERY Bilateral 1991  . COLON RESECTION  10/1998  . COLPOSCOPY  ~1990   cx dysplasia  . cryosurgery cervix    . ESSURE TUBAL LIGATION Bilateral 10/2009   in office  . PARTIAL COLECTOMY  11/08   J Pouch inserted  . REDUCTION MAMMAPLASTY  Bilateral   . SHOULDER ARTHROSCOPY W/ ACROMIAL REPAIR Right 09/2011   and removal of bone spur  . TONSILLECTOMY  child    Current Outpatient Medications  Medication Sig Dispense Refill  . Cinnamon 500 MG TABS Take 2 tablets by mouth daily.    . clobetasol ointment (TEMOVATE) 0.05 % Apply to the area one time per day. 30 g 0  . loperamide (IMODIUM) 2 MG capsule Take 2 mg by mouth 4 (four) times daily as needed. For constipation     . nystatin cream (MYCOSTATIN) Apply 1 application topically 2 (two) times daily. Apply to affected area BID prn 30 g 0   No current facility-administered medications for this visit.    Family History  Adopted: Yes  Problem Relation Age of Onset  . Diabetes Maternal Grandmother   . Diabetes Maternal Grandfather   . Diabetes Paternal Grandmother   . Diabetes Paternal Grandfather     Review of Systems  All other systems reviewed and are negative.   Exam:   BP 122/76   Pulse 88   Ht 5' 1.5" (1.562 m)   Wt 152 lb (68.9 kg)   LMP 03/12/2014 (Approximate)   SpO2 98%   BMI 28.26 kg/m   Weight change: @WEIGHTCHANGE @ Height:   Height: 5' 1.5" (156.2  cm)  Ht Readings from Last 3 Encounters:  06/21/20 5' 1.5" (1.562 m)  02/22/19 5' 1.5" (1.562 m)  09/12/18 5\' 1"  (1.549 m)    General appearance: alert, cooperative and appears stated age Head: Normocephalic, without obvious abnormality, atraumatic Neck: no adenopathy, supple, symmetrical, trachea midline and thyroid normal to inspection and palpation Lungs: clear to auscultation bilaterally Cardiovascular: regular rate and rhythm Breasts: normal appearance, no masses or tenderness, evidence of bilateral breast reduction Abdomen: soft, non-tender; non distended,  no masses,  no organomegaly Extremities: extremities normal, atraumatic, no cyanosis or edema Skin: Skin color, texture, turgor normal. No rashes or lesions Lymph nodes: Cervical, supraclavicular, and axillary nodes normal. No abnormal  inguinal nodes palpated Neurologic: Grossly normal   Pelvic: External genitalia:  no lesions              Urethra:  normal appearing urethra with no masses, tenderness or lesions              Bartholins and Skenes: normal                 Vagina: normal appearing vagina with normal color and discharge, no lesions              Cervix: no lesions               Bimanual Exam:  Uterus:  normal size, contour, position, consistency, mobility, non-tender              Adnexa: no mass, fullness, tenderness               Rectovaginal: deferred  Gae Dry chaperoned for the exam.  A:  Well Woman with normal exam  H/O colectomy  H/O DM, she hasn't been monitoring  Vit d def  H/O elevated lipids. Hasn't had blood work for several years  P:   No pap this year  No colonoscopy needed  Mammogram UTD  Discussed breast self exam  Discussed calcium and vit D intake  Screening labs, HgbA1C, vit d (she isn't fasting)

## 2020-06-21 ENCOUNTER — Ambulatory Visit (INDEPENDENT_AMBULATORY_CARE_PROVIDER_SITE_OTHER): Payer: 59 | Admitting: Obstetrics and Gynecology

## 2020-06-21 ENCOUNTER — Encounter: Payer: Self-pay | Admitting: Obstetrics and Gynecology

## 2020-06-21 ENCOUNTER — Other Ambulatory Visit: Payer: Self-pay

## 2020-06-21 VITALS — BP 122/76 | HR 88 | Ht 61.5 in | Wt 152.0 lb

## 2020-06-21 DIAGNOSIS — Z9049 Acquired absence of other specified parts of digestive tract: Secondary | ICD-10-CM

## 2020-06-21 DIAGNOSIS — E782 Mixed hyperlipidemia: Secondary | ICD-10-CM

## 2020-06-21 DIAGNOSIS — Z8639 Personal history of other endocrine, nutritional and metabolic disease: Secondary | ICD-10-CM

## 2020-06-21 DIAGNOSIS — Z01419 Encounter for gynecological examination (general) (routine) without abnormal findings: Secondary | ICD-10-CM

## 2020-06-21 DIAGNOSIS — Z0000 Encounter for general adult medical examination without abnormal findings: Secondary | ICD-10-CM

## 2020-06-21 DIAGNOSIS — E559 Vitamin D deficiency, unspecified: Secondary | ICD-10-CM | POA: Diagnosis not present

## 2020-06-21 NOTE — Patient Instructions (Signed)

## 2020-06-22 LAB — CBC
Hematocrit: 43.3 % (ref 34.0–46.6)
Hemoglobin: 14 g/dL (ref 11.1–15.9)
MCH: 30.4 pg (ref 26.6–33.0)
MCHC: 32.3 g/dL (ref 31.5–35.7)
MCV: 94 fL (ref 79–97)
Platelets: 289 10*3/uL (ref 150–450)
RBC: 4.61 x10E6/uL (ref 3.77–5.28)
RDW: 11.9 % (ref 11.7–15.4)
WBC: 10 10*3/uL (ref 3.4–10.8)

## 2020-06-22 LAB — COMPREHENSIVE METABOLIC PANEL
ALT: 24 IU/L (ref 0–32)
AST: 22 IU/L (ref 0–40)
Albumin/Globulin Ratio: 1.6 (ref 1.2–2.2)
Albumin: 4.5 g/dL (ref 3.8–4.9)
Alkaline Phosphatase: 140 IU/L — ABNORMAL HIGH (ref 44–121)
BUN/Creatinine Ratio: 14 (ref 9–23)
BUN: 13 mg/dL (ref 6–24)
Bilirubin Total: 0.6 mg/dL (ref 0.0–1.2)
CO2: 22 mmol/L (ref 20–29)
Calcium: 9.9 mg/dL (ref 8.7–10.2)
Chloride: 102 mmol/L (ref 96–106)
Creatinine, Ser: 0.91 mg/dL (ref 0.57–1.00)
GFR calc Af Amer: 83 mL/min/{1.73_m2} (ref >59)
GFR calc non Af Amer: 72 mL/min/{1.73_m2} (ref >59)
Globulin, Total: 2.8 g/dL (ref 1.5–4.5)
Glucose: 247 mg/dL — ABNORMAL HIGH (ref 65–99)
Potassium: 4.5 mmol/L (ref 3.5–5.2)
Sodium: 141 mmol/L (ref 134–144)
Total Protein: 7.3 g/dL (ref 6.0–8.5)

## 2020-06-22 LAB — LIPID PANEL
Chol/HDL Ratio: 4.4 ratio (ref 0.0–4.4)
Cholesterol, Total: 311 mg/dL — ABNORMAL HIGH (ref 100–199)
HDL: 71 mg/dL (ref >39)
LDL Chol Calc (NIH): 174 mg/dL — ABNORMAL HIGH (ref 0–99)
Triglycerides: 345 mg/dL — ABNORMAL HIGH (ref 0–149)
VLDL Cholesterol Cal: 66 mg/dL — ABNORMAL HIGH (ref 5–40)

## 2020-06-22 LAB — HEMOGLOBIN A1C
Est. average glucose Bld gHb Est-mCnc: 240 mg/dL
Hgb A1c MFr Bld: 10 % — ABNORMAL HIGH (ref 4.8–5.6)

## 2020-06-22 LAB — VITAMIN D 25 HYDROXY (VIT D DEFICIENCY, FRACTURES): Vit D, 25-Hydroxy: 23.4 ng/mL — ABNORMAL LOW (ref 30.0–100.0)

## 2021-05-08 DIAGNOSIS — M7502 Adhesive capsulitis of left shoulder: Secondary | ICD-10-CM | POA: Insufficient documentation

## 2021-06-08 DIAGNOSIS — M25512 Pain in left shoulder: Secondary | ICD-10-CM | POA: Insufficient documentation

## 2021-06-25 NOTE — Progress Notes (Deleted)
55 y.o. G0P0000 Divorced White or Caucasian Not Hispanic or Latino female here for annual exam.      Patient's last menstrual period was 03/12/2014 (approximate).          Sexually active: {yes no:314532}  The current method of family planning is {contraception:315051}.    Exercising: {yes no:314532}  {types:19826} Smoker:  {YES [id number]  Health Maintenance: Pap:  12/24/2016 WNL NEG HPV, 08/15/14 Negative with neg HR HPV History of abnormal Pap:  Yes early 20's, cryosurgery.   MMG:  12/15/19 density A Bi-rads 1 neg  BMD:   none  Colonoscopy: does not have a colon  TDaP:  12/20/15  Gardasil: n/a   reports that she has never smoked. She has never used smokeless tobacco. She reports current alcohol use. She reports that she does not use drugs.  Past Medical History:  Diagnosis Date   Anemia    Crohn's    Diabetes mellitus without complication ([place]) 73/71   Inflammatory arthritis 06/2009   Squamous cell skin cancer    arm   STD (sexually transmitted disease) 8/04   pos HSV 2 serology   Ulcerative colitis ([place])    [facility] History, Care Everywhere    Past Surgical History:  Procedure Laterality Date   APPENDECTOMY  4/04   laparoscopic   BREAST REDUCTION SURGERY Bilateral 1991   COLON RESECTION  10/1998   COLPOSCOPY  ~1990   cx dysplasia   cryosurgery cervix     ESSURE TUBAL LIGATION Bilateral 10/2009   in office   PARTIAL COLECTOMY  11/08   J Pouch inserted   REDUCTION MAMMAPLASTY Bilateral    SHOULDER ARTHROSCOPY W/ ACROMIAL REPAIR Right 09/2011   and removal of bone spur   TONSILLECTOMY  child    Current Outpatient Medications  Medication Sig Dispense Refill   Cinnamon 500 MG TABS Take 2 tablets by mouth daily.     clobetasol ointment (TEMOVATE) 0.05 % Apply to the area one time per day. 30 g 0   loperamide (IMODIUM) 2 MG capsule Take 2 mg by mouth 4 (four) times daily as needed. For constipation      nystatin cream (MYCOSTATIN) Apply 1 application topically 2 (two) times  daily. Apply to affected area BID prn 30 g 0   No current facility-administered medications for this visit.    Family History  Adopted: Yes  Problem Relation Age of Onset   Diabetes Maternal Grandmother    Diabetes Maternal Grandfather    Diabetes Paternal Grandmother    Diabetes Paternal Grandfather     Review of Systems  Exam:   LMP 03/12/2014 (Approximate)   Weight change: @WEIGHTCHANGE @ Height:      Ht Readings from Last 3 Encounters:  06/21/20 5' 1.5" (1.562 m)  02/22/19 5' 1.5" (1.562 m)  09/12/18 5\' 1"  (1.549 m)    General appearance: alert, cooperative and appears stated age Head: Normocephalic, without obvious abnormality, atraumatic Neck: no adenopathy, supple, symmetrical, trachea midline and thyroid {CHL AMB PHY EX THYROID NORM DEFAULT:[phone]::"normal to inspection and palpation"} Lungs: clear to auscultation bilaterally Cardiovascular: regular rate and rhythm Breasts: {Exam; breast:13139::"normal appearance, no masses or tenderness"} Abdomen: soft, non-tender; non distended,  no masses,  no organomegaly Extremities: extremities normal, atraumatic, no cyanosis or edema Skin: Skin color, texture, turgor normal. No rashes or lesions Lymph nodes: Cervical, supraclavicular, and axillary nodes normal. No abnormal inguinal nodes palpated Neurologic: Grossly normal   Pelvic: External genitalia:  no lesions  Urethra:  normal appearing urethra with no masses, tenderness or lesions              Bartholins and Skenes: normal                 Vagina: normal appearing vagina with normal color and discharge, no lesions              Cervix: {CHL AMB PHY EX CERVIX NORM DEFAULT:[phone]::"no lesions"}               Bimanual Exam:  Uterus:  {CHL AMB PHY EX UTERUS NORM [phone]::"normal size, contour, position, consistency, mobility, non-tender"}              Adnexa: {CHL AMB PHY EX ADNEXA NO MASS DEFAULT:[phone]::"no mass, fullness, tenderness"}                Rectovaginal: Confirms               Anus:  normal sphincter tone, no lesions  *** chaperoned for the exam.  A:  Well Woman with normal exam  P:

## 2021-06-27 ENCOUNTER — Ambulatory Visit: Payer: 59 | Admitting: Obstetrics and Gynecology

## 2021-06-28 NOTE — Progress Notes (Signed)
55 y.o. G0P0000 Divorced White or Caucasian Not Hispanic or Latino female here for annual exam.  No vaginal. Not sexually active.    H/O eczematous dermatitis on the vulva.   H/O DM, on oral medication. A few months ago her HgbA1C was under 7. Being seen every 6 months.    H/O colon resection for Crohn's colitis. Stool can be watery or loose.   In the last year she has lost 22 lbs, her labs have improved. She feels better.   Patient's last menstrual period was 03/12/2014 (approximate).          Sexually active: No.  The current method of family planning is post menopausal status.    Exercising: No.  The patient does not participate in regular exercise at present. Smoker:  no  Health Maintenance: Pap:  Pap: 12/24/2016 WNL NEG HPV, 08/15/14 Negative with neg HR HPV History of abnormal Pap:  Yes early 20's, cryosurgery.   MMG:  12/15/19 density A Bi-rads 1 neg  BMD:   none  Colonoscopy: does not have a colon  TDaP:  12/20/15  Gardasil: na   reports that she has never smoked. She has never used smokeless tobacco. She reports current alcohol use. She reports that she does not use drugs. Rare ETOH. 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   Past Medical History:  Diagnosis Date   Anemia    Crohn's    Diabetes mellitus without complication ([place]) 44/81   Inflammatory arthritis 06/2009   Squamous cell skin cancer    arm   STD (sexually transmitted disease) 8/04   pos HSV 2 serology   Ulcerative colitis ([place])    [facility] History, Care Everywhere    Past Surgical History:  Procedure Laterality Date   APPENDECTOMY  4/04   laparoscopic   BREAST REDUCTION SURGERY Bilateral 1991   COLON RESECTION  10/1998   COLPOSCOPY  ~1990   cx dysplasia   cryosurgery cervix     ESSURE TUBAL LIGATION Bilateral 10/2009   in office   PARTIAL COLECTOMY  11/08   J Pouch inserted   REDUCTION MAMMAPLASTY Bilateral    SHOULDER ARTHROSCOPY W/ ACROMIAL REPAIR  Right 09/2011   and removal of bone spur   TONSILLECTOMY  child    Current Outpatient Medications  Medication Sig Dispense Refill   clobetasol ointment (TEMOVATE) 0.05 % Apply to the area one time per day. 30 g 0   glipiZIDE (GLUCOTROL) 10 MG tablet Take 10 mg by mouth daily before breakfast.     HYDROXYZINE HCL PO Take by mouth.     loperamide (IMODIUM) 2 MG capsule Take 2 mg by mouth 4 (four) times daily as needed. For constipation      nystatin cream (MYCOSTATIN) Apply 1 application topically 2 (two) times daily. Apply to affected area BID prn 30 g 0   No current facility-administered medications for this visit.    Family History  Adopted: Yes  Problem Relation Age of Onset   Diabetes Maternal Grandmother    Diabetes Maternal Grandfather    Diabetes Paternal Grandmother    Diabetes Paternal Grandfather     Review of Systems  All other systems reviewed and are negative.  Exam:   BP 122/68   Pulse 78   Ht 5\' 1"  (1.549 m)   Wt 127 lb (57.6 kg)   LMP 03/12/2014 (Approximate)   SpO2 100%   BMI 24.00 kg/m   Weight change: @  WEIGHTCHANGE@ Height:   Height: 5\' 1"  (154.9 cm)  Ht Readings from Last 3 Encounters:  07/09/21 5\' 1"  (1.549 m)  06/21/20 5' 1.5" (1.562 m)  02/22/19 5' 1.5" (1.562 m)    General appearance: alert, cooperative and appears stated age Head: Normocephalic, without obvious abnormality, atraumatic Neck: no adenopathy, supple, symmetrical, trachea midline and thyroid normal to inspection and palpation Lungs: clear to auscultation bilaterally Cardiovascular: regular rate and rhythm Breasts: normal appearance, no masses or tenderness Abdomen: soft, non-tender; non distended,  no masses,  no organomegaly Extremities: extremities normal, atraumatic, no cyanosis or edema Skin: Skin color, texture, turgor normal. No rashes or lesions Lymph nodes: Cervical, supraclavicular, and axillary nodes normal. No abnormal inguinal nodes palpated Neurologic: Grossly  normal   Pelvic: External genitalia:  no lesions              Urethra:  normal appearing urethra with no masses, tenderness or lesions              Bartholins and Skenes: normal                 Vagina: normal appearing vagina with normal color and discharge, no lesions              Cervix: no lesions               Bimanual Exam:  Uterus:  normal size, contour, position, consistency, mobility, non-tender              Adnexa: no mass, fullness, tenderness               Rectovaginal: declines  Gae Dry chaperoned for the exam.  1. Well woman exam Mammogram overdue, she will schedule Labs with primary Discussed breast self exam Discussed calcium and vit D intake No colonoscopy needed  2. Screening for cervical cancer - Cytology - PAP with HPV

## 2021-07-09 ENCOUNTER — Ambulatory Visit (INDEPENDENT_AMBULATORY_CARE_PROVIDER_SITE_OTHER): Payer: 59 | Admitting: Obstetrics and Gynecology

## 2021-07-09 ENCOUNTER — Other Ambulatory Visit: Payer: Self-pay

## 2021-07-09 ENCOUNTER — Encounter: Payer: Self-pay | Admitting: Obstetrics and Gynecology

## 2021-07-09 ENCOUNTER — Other Ambulatory Visit (HOSPITAL_COMMUNITY)
Admission: RE | Admit: 2021-07-09 | Discharge: 2021-07-09 | Disposition: A | Discharge status: Home / Self Care | Payer: 59 | Source: Ambulatory Visit | Attending: Obstetrics and Gynecology | Admitting: Obstetrics and Gynecology

## 2021-07-09 VITALS — BP 122/68 | HR 78 | Ht 61.0 in | Wt 127.0 lb

## 2021-07-09 DIAGNOSIS — Z124 Encounter for screening for malignant neoplasm of cervix: Secondary | ICD-10-CM | POA: Insufficient documentation

## 2021-07-09 DIAGNOSIS — Z01419 Encounter for gynecological examination (general) (routine) without abnormal findings: Secondary | ICD-10-CM | POA: Diagnosis not present

## 2021-07-09 NOTE — Patient Instructions (Signed)

## 2021-07-10 LAB — CYTOLOGY - PAP
Adequacy: ABSENT
Comment: NEGATIVE
Diagnosis: NEGATIVE
High risk HPV: NEGATIVE

## 2021-09-28 ENCOUNTER — Other Ambulatory Visit: Payer: Self-pay | Admitting: Obstetrics and Gynecology

## 2021-09-28 DIAGNOSIS — Z1231 Encounter for screening mammogram for malignant neoplasm of breast: Secondary | ICD-10-CM

## 2021-10-18 ENCOUNTER — Ambulatory Visit
Admission: RE | Admit: 2021-10-18 | Discharge: 2021-10-18 | Disposition: A | Discharge status: Home / Self Care | Payer: 59 | Source: Ambulatory Visit | Attending: Obstetrics and Gynecology | Admitting: Obstetrics and Gynecology

## 2021-10-18 DIAGNOSIS — Z1231 Encounter for screening mammogram for malignant neoplasm of breast: Secondary | ICD-10-CM

## 2022-07-08 NOTE — Progress Notes (Signed)
56 y.o. G0P0000 Divorced White or Caucasian Not Hispanic or Latino female here for annual exam.  No vaginal bleeding. No bladder c/o.    H/O eczematous dermatitis on the vulva. No current problems.   H/O colon resection for Crohn's colitis. Stool can be watery or loose.   H/O DM, controlled on medication, last HgbA1C was 6.3%.    Patient's last menstrual period was 03/12/2014 (approximate).          Sexually active: No.  The current method of family planning is post menopausal status.    Exercising: No.  The patient does not participate in regular exercise at present. Smoker:  no  Health Maintenance: Pap: Pap: 07/09/21 WNL, neg HPV; 12/24/2016 WNL NEG HPV, 08/15/14 Negative with neg HR HPV History of abnormal Pap:  Yes early 20's, cryosurgery.   MMG:  10/18/21 density A Bi-rads 1 neg  BMD:   none Colonoscopy does not have a colon  TDaP:  12/20/15  Gardasil: na   reports that she has never smoked. She has never used smokeless tobacco. She reports current alcohol use. She reports that she does not use drugs. Rare ETOH. 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  Past Medical History:  Diagnosis Date   Anemia    Crohn's    Diabetes mellitus without complication ([place]) 19/37   Inflammatory arthritis 06/2009   Squamous cell skin cancer    arm   STD (sexually transmitted disease) 8/04   pos HSV 2 serology   Ulcerative colitis ([place])    [facility] History, Care Everywhere    Past Surgical History:  Procedure Laterality Date   APPENDECTOMY  11/2002   laparoscopic   BREAST REDUCTION SURGERY Bilateral 1991   COLON RESECTION  10/1998   COLPOSCOPY  ~1990   cx dysplasia   cryosurgery cervix     ESSURE TUBAL LIGATION Bilateral 10/2009   in office   left shoulder repair Left 2021   PARTIAL COLECTOMY  06/2007   J Pouch inserted   REDUCTION MAMMAPLASTY Bilateral    SHOULDER ARTHROSCOPY W/ ACROMIAL REPAIR Right  09/2011   and removal of bone spur   TONSILLECTOMY  child    Current Outpatient Medications  Medication Sig Dispense Refill   clobetasol ointment (TEMOVATE) 0.05 % Apply to the area one time per day. 30 g 0   glipiZIDE (GLUCOTROL) 10 MG tablet Take 10 mg by mouth daily before breakfast.     HYDROXYZINE HCL PO Take by mouth.     loperamide (IMODIUM) 2 MG capsule Take 2 mg by mouth 4 (four) times daily as needed. For constipation      nystatin cream (MYCOSTATIN) Apply 1 application topically 2 (two) times daily. Apply to affected area BID prn 30 g 0   No current facility-administered medications for this visit.    Family History  Adopted: Yes  Problem Relation Age of Onset   Diabetes Maternal Grandmother    Diabetes Maternal Grandfather    Diabetes Paternal Grandmother    Diabetes Paternal Grandfather     Review of Systems  All other systems reviewed and are negative.   Exam:   BP 104/62   Pulse 72   Ht 5' 1.5" (1.562 m)   Wt 138 lb (62.6 kg)   LMP 03/12/2014 (Approximate)   SpO2 98%   BMI 25.65 kg/m   Weight change: '@WEIGHTCHANGE'$ @ Height:   Height: 5' 1.5" (156.2  cm)  Ht Readings from Last 3 Encounters:  07/17/22 5' 1.5" (1.562 m)  07/09/21 '5\' 1"'$  (1.549 m)  06/21/20 5' 1.5" (1.562 m)    General appearance: alert, cooperative and appears stated age Head: Normocephalic, without obvious abnormality, atraumatic Neck: no adenopathy, supple, symmetrical, trachea midline and thyroid normal to inspection and palpation Lungs: clear to auscultation bilaterally Cardiovascular: regular rate and rhythm Breasts: normal appearance, no masses or tenderness Abdomen: soft, non-tender; non distended,  no masses,  no organomegaly Extremities: extremities normal, atraumatic, no cyanosis or edema Skin: Skin color, texture, turgor normal. No rashes or lesions Lymph nodes: Cervical, supraclavicular, and axillary nodes normal. No abnormal inguinal nodes palpated Neurologic: Grossly  normal   Pelvic: External genitalia:  no lesions              Urethra:  normal appearing urethra with no masses, tenderness or lesions              Bartholins and Skenes: normal                 Vagina: mildly atrophic appearing vagina with normal color and discharge, no lesions              Cervix: no lesions               Bimanual Exam:  Uterus:  normal size, contour, position, consistency, mobility, non-tender              Adnexa: no mass, fullness, tenderness               Rectovaginal: Confirms               Anus:  normal sphincter tone, no lesions  Gae Dry, CMA chaperoned for the exam.  1. Well woman exam Labs with primary Discussed breast self exam Discussed calcium and vit D intake Mammogram in 3/24 No colonoscopy needed No pap this year

## 2022-07-17 ENCOUNTER — Ambulatory Visit (INDEPENDENT_AMBULATORY_CARE_PROVIDER_SITE_OTHER): Payer: 59 | Admitting: Obstetrics and Gynecology

## 2022-07-17 ENCOUNTER — Encounter: Payer: Self-pay | Admitting: Obstetrics and Gynecology

## 2022-07-17 VITALS — BP 104/62 | HR 72 | Ht 61.5 in | Wt 138.0 lb

## 2022-07-17 DIAGNOSIS — Z01419 Encounter for gynecological examination (general) (routine) without abnormal findings: Secondary | ICD-10-CM | POA: Diagnosis not present

## 2022-07-17 NOTE — Patient Instructions (Signed)

## 2023-02-05 ENCOUNTER — Other Ambulatory Visit: Payer: Self-pay | Admitting: Obstetrics and Gynecology

## 2023-02-05 DIAGNOSIS — Z1231 Encounter for screening mammogram for malignant neoplasm of breast: Secondary | ICD-10-CM

## 2023-02-14 ENCOUNTER — Ambulatory Visit
Admission: RE | Admit: 2023-02-14 | Discharge: 2023-02-14 | Disposition: A | Discharge status: Home / Self Care | Payer: 59 | Source: Ambulatory Visit | Attending: Obstetrics and Gynecology | Admitting: Obstetrics and Gynecology

## 2023-02-14 DIAGNOSIS — Z1231 Encounter for screening mammogram for malignant neoplasm of breast: Secondary | ICD-10-CM

## 2023-03-06 ENCOUNTER — Ambulatory Visit: Payer: 59
